# Patient Record
Sex: Female | Born: 1935 | Race: White | Hispanic: No | Smoking: Never smoker
Health system: Southern US, Community
[De-identification: ages and names within clinical notes are randomized; demographics above are authoritative.]

## PROBLEM LIST (undated history)

## (undated) DIAGNOSIS — R42 Dizziness and giddiness: Secondary | ICD-10-CM

## (undated) DIAGNOSIS — G20 Parkinson's disease: Secondary | ICD-10-CM

## (undated) DIAGNOSIS — E119 Type 2 diabetes mellitus without complications: Secondary | ICD-10-CM

## (undated) DIAGNOSIS — R413 Other amnesia: Secondary | ICD-10-CM

## (undated) DIAGNOSIS — Z9181 History of falling: Secondary | ICD-10-CM

## (undated) DIAGNOSIS — R269 Unspecified abnormalities of gait and mobility: Secondary | ICD-10-CM

## (undated) HISTORY — DX: Other amnesia: R41.3

## (undated) HISTORY — DX: History of falling: Z91.81

## (undated) HISTORY — DX: Unspecified abnormalities of gait and mobility: R26.9

## (undated) HISTORY — PX: VAGINAL HYSTERECTOMY: SUR661

## (undated) HISTORY — DX: Parkinson's disease: G20

## (undated) HISTORY — PX: OTHER SURGICAL HISTORY: SHX169

## (undated) HISTORY — DX: Dizziness and giddiness: R42

---

## 2008-09-14 ENCOUNTER — Encounter: Admission: RE | Admit: 2008-09-14 | Discharge: 2008-09-14 | Payer: Self-pay | Admitting: Emergency Medicine

## 2009-03-13 ENCOUNTER — Encounter: Admission: RE | Admit: 2009-03-13 | Discharge: 2009-03-13 | Payer: Self-pay | Admitting: Emergency Medicine

## 2010-03-08 ENCOUNTER — Inpatient Hospital Stay (HOSPITAL_COMMUNITY): Admission: EM | Admit: 2010-03-08 | Discharge: 2009-05-03 | Payer: Self-pay | Admitting: Emergency Medicine

## 2010-06-18 LAB — CBC
HCT: 39.9 % (ref 36.0–46.0)
MCHC: 33.2 g/dL (ref 30.0–36.0)
MCV: 94.7 fL (ref 78.0–100.0)
Platelets: 204 10*3/uL (ref 150–400)

## 2010-06-18 LAB — BASIC METABOLIC PANEL
BUN: 25 mg/dL — ABNORMAL HIGH (ref 6–23)
Chloride: 101 mEq/L (ref 96–112)
Potassium: 4.5 mEq/L (ref 3.5–5.1)

## 2010-06-18 LAB — DIFFERENTIAL
Basophils Relative: 1 % (ref 0–1)
Eosinophils Absolute: 0.2 10*3/uL (ref 0.0–0.7)
Neutrophils Relative %: 73 % (ref 43–77)

## 2010-06-18 LAB — POCT CARDIAC MARKERS: Troponin i, poc: <0.05 ng/mL (ref 0.00–0.09)

## 2010-06-21 LAB — DIFFERENTIAL
Eosinophils Relative: 2 % (ref 0–5)
Lymphocytes Relative: 24 % (ref 12–46)
Lymphs Abs: 1.8 10*3/uL (ref 0.7–4.0)
Neutrophils Relative %: 64 % (ref 43–77)

## 2010-06-21 LAB — COMPREHENSIVE METABOLIC PANEL
BUN: 14 mg/dL (ref 6–23)
BUN: 23 mg/dL (ref 6–23)
CO2: 25 mEq/L (ref 19–32)
CO2: 29 mEq/L (ref 19–32)
Calcium: 8.3 mg/dL — ABNORMAL LOW (ref 8.4–10.5)
Calcium: 8.8 mg/dL (ref 8.4–10.5)
Chloride: 102 mEq/L (ref 96–112)
Creatinine, Ser: 0.79 mg/dL (ref 0.4–1.2)
Creatinine, Ser: 0.95 mg/dL (ref 0.4–1.2)
GFR calc Af Amer: >60 mL/min (ref >60)
GFR calc non Af Amer: 57 mL/min — ABNORMAL LOW (ref >60)
GFR calc non Af Amer: >60 mL/min (ref >60)
Glucose, Bld: 119 mg/dL — ABNORMAL HIGH (ref 70–99)
Glucose, Bld: 121 mg/dL — ABNORMAL HIGH (ref 70–99)
Sodium: 137 mEq/L (ref 135–145)
Total Bilirubin: 0.8 mg/dL (ref 0.3–1.2)

## 2010-06-21 LAB — CBC
HCT: 36.1 % (ref 36.0–46.0)
Hemoglobin: 12.1 g/dL (ref 12.0–15.0)
MCHC: 33.7 g/dL (ref 30.0–36.0)
Platelets: 178 10*3/uL (ref 150–400)
Platelets: 181 10*3/uL (ref 150–400)
RDW: 14.9 % (ref 11.5–15.5)
WBC: 7.7 10*3/uL (ref 4.0–10.5)

## 2010-06-21 LAB — CARDIAC PANEL(CRET KIN+CKTOT+MB+TROPI)
CK, MB: 3.3 ng/mL (ref 0.3–4.0)
CK, MB: 3.3 ng/mL (ref 0.3–4.0)
Relative Index: INVALID (ref 0.0–2.5)
Relative Index: INVALID (ref 0.0–2.5)
Relative Index: INVALID (ref 0.0–2.5)
Total CK: 53 U/L (ref 7–177)
Troponin I: 0.01 ng/mL (ref 0.00–0.06)
Troponin I: 0.03 ng/mL (ref 0.00–0.06)

## 2010-06-21 LAB — TSH: TSH: 4.019 u[IU]/mL (ref 0.350–4.500)

## 2010-06-21 LAB — LIPID PANEL
Cholesterol: 165 mg/dL (ref 0–200)
HDL: 65 mg/dL (ref >39)
Total CHOL/HDL Ratio: 2.5 RATIO
Triglycerides: 42 mg/dL (ref <150)

## 2010-06-21 LAB — TROPONIN I: Troponin I: 0.01 ng/mL (ref 0.00–0.06)

## 2010-06-21 LAB — CK TOTAL AND CKMB (NOT AT ARMC): Relative Index: INVALID (ref 0.0–2.5)

## 2010-07-12 ENCOUNTER — Other Ambulatory Visit (HOSPITAL_COMMUNITY): Payer: Self-pay | Admitting: *Deleted

## 2010-08-01 ENCOUNTER — Ambulatory Visit (INDEPENDENT_AMBULATORY_CARE_PROVIDER_SITE_OTHER): Payer: Medicare Other | Admitting: Obstetrics & Gynecology

## 2010-08-01 DIAGNOSIS — N993 Prolapse of vaginal vault after hysterectomy: Secondary | ICD-10-CM

## 2010-08-02 NOTE — Assessment & Plan Note (Signed)
NAMECHELCIE, ESTORGA            ACCOUNT NO.:  [id number]  MEDICAL RECORD NO.:  [id number]          PATIENT TYPE:  LOCATION:  [facility] at [facility]           FACILITY:  PHYSICIAN:  Allie Bossier, MD        DATE OF BIRTH:  05-Mar-1928  DATE OF SERVICE:  08/01/2010                                 CLINIC NOTE  Ms. Kanno is an 76 year old widowed white, G2 P1 A1.  She comes in here as a referral from [facility] for genital prolapse. The patient herself denies any vaginal pressure or pain.  She does say that her biggest problem is that she has to get up and void approximately four times per night.  She says that if she does any Valsalva maneuvers, approximately 50% of the times she will have leaking of urine.  She denies urge incontinence, but she does wear Depends on a daily basis.  She is currently abstinent.  PAST MEDICAL HISTORY: 1. Parkinson's and its side effects. 2. Osteoporosis. 3. Hypertension. 4. Hypothyroidism. 5. Diabetes, type 2. 6. Hyperlipidemia.  PAST SURGICAL HISTORY:  She had a total abdominal hysterectomy in the past for benign indications.  On exam, she has complete prolapse of her vaginal cuff.  It is easily reducible.  The skin is relatively hypertrophic, but I have certainly seen worse.  There are no areas of excoriation.  After she had changed her clothes on her physical exam, I invited her son to come in for the discussion. I gave them printed literature on prolapse and explained at length her treatment options, specifically watchful waiting versus a pessary.  I would not consider her a surgical candidate as I have seen multiple failures of prolapse surgeries when the entire vaginal cuff has been prolapsed.  Because she is essentially asymptomatic, she chooses to delay or forego treatment entirely.  I have explained to her that should she decide to have a pessary she would need to use estrogen cream for a month prior to insertion of the  pessary and then use some vaginal estrogen while the pessary is in place.  All questions were answered. She will come back on a p.r.n. basis.  Thank you very much for this kind referral.     Allie Bossier, MD   MCD/MEDQ  D:  08/01/2010  T:  08/02/2010  Job:  [id number]  cc:   [facility] [address] [place]

## 2010-09-17 ENCOUNTER — Ambulatory Visit (HOSPITAL_COMMUNITY)
Admission: RE | Admit: 2010-09-17 | Discharge: 2010-09-17 | Disposition: A | Discharge status: Home / Self Care | Payer: Medicare Other | Source: Ambulatory Visit | Attending: Family Medicine | Admitting: Family Medicine

## 2010-09-17 DIAGNOSIS — Z13820 Encounter for screening for osteoporosis: Secondary | ICD-10-CM | POA: Insufficient documentation

## 2010-09-17 DIAGNOSIS — Z780 Asymptomatic menopausal state: Secondary | ICD-10-CM | POA: Insufficient documentation

## 2010-09-30 ENCOUNTER — Emergency Department (HOSPITAL_COMMUNITY): Payer: Medicare Other

## 2010-09-30 ENCOUNTER — Inpatient Hospital Stay (HOSPITAL_COMMUNITY)
Admission: EM | Admit: 2010-09-30 | Discharge: 2010-10-02 | DRG: 093 | Disposition: A | Discharge status: Home Health | Payer: Medicare Other | Attending: Family Medicine | Admitting: Family Medicine

## 2010-09-30 DIAGNOSIS — R269 Unspecified abnormalities of gait and mobility: Principal | ICD-10-CM | POA: Diagnosis present

## 2010-09-30 DIAGNOSIS — I10 Essential (primary) hypertension: Secondary | ICD-10-CM | POA: Diagnosis present

## 2010-09-30 DIAGNOSIS — E785 Hyperlipidemia, unspecified: Secondary | ICD-10-CM | POA: Diagnosis present

## 2010-09-30 DIAGNOSIS — G20A1 Parkinson's disease without dyskinesia, without mention of fluctuations: Secondary | ICD-10-CM | POA: Diagnosis present

## 2010-09-30 DIAGNOSIS — Z7982 Long term (current) use of aspirin: Secondary | ICD-10-CM

## 2010-09-30 DIAGNOSIS — Z951 Presence of aortocoronary bypass graft: Secondary | ICD-10-CM

## 2010-09-30 DIAGNOSIS — R32 Unspecified urinary incontinence: Secondary | ICD-10-CM | POA: Diagnosis present

## 2010-09-30 DIAGNOSIS — G20 Parkinson's disease: Secondary | ICD-10-CM | POA: Diagnosis present

## 2010-09-30 DIAGNOSIS — I2510 Atherosclerotic heart disease of native coronary artery without angina pectoris: Secondary | ICD-10-CM | POA: Diagnosis present

## 2010-09-30 LAB — CBC
Hemoglobin: 12.9 g/dL (ref 12.0–15.0)
MCH: 30.2 pg (ref 26.0–34.0)
MCV: 89.9 fL (ref 78.0–100.0)
RBC: 4.27 MIL/uL (ref 3.87–5.11)

## 2010-09-30 LAB — BASIC METABOLIC PANEL
BUN: 25 mg/dL — ABNORMAL HIGH (ref 6–23)
CO2: 25 mEq/L (ref 19–32)
Calcium: 8.9 mg/dL (ref 8.4–10.5)
Creatinine, Ser: 0.79 mg/dL (ref 0.50–1.10)

## 2010-09-30 LAB — URINALYSIS, ROUTINE W REFLEX MICROSCOPIC
Bilirubin Urine: NEGATIVE
Glucose, UA: NEGATIVE mg/dL
Hgb urine dipstick: NEGATIVE
Ketones, ur: NEGATIVE mg/dL
Protein, ur: NEGATIVE mg/dL

## 2010-09-30 LAB — CK TOTAL AND CKMB (NOT AT ARMC): Total CK: 38 U/L (ref 7–177)

## 2010-09-30 LAB — DIFFERENTIAL
Basophils Relative: 0 % (ref 0–1)
Lymphs Abs: 1.7 10*3/uL (ref 0.7–4.0)
Monocytes Relative: 8 % (ref 3–12)
Neutro Abs: 6.5 10*3/uL (ref 1.7–7.7)
Neutrophils Relative %: 71 % (ref 43–77)

## 2010-09-30 LAB — GLUCOSE, CAPILLARY: Glucose-Capillary: 105 mg/dL — ABNORMAL HIGH (ref 70–99)

## 2010-09-30 LAB — APTT: aPTT: 34 seconds (ref 24–37)

## 2010-09-30 LAB — TROPONIN I: Troponin I: <0.3 ng/mL (ref <0.30)

## 2010-10-01 DIAGNOSIS — G20 Parkinson's disease: Secondary | ICD-10-CM

## 2010-10-01 DIAGNOSIS — H8110 Benign paroxysmal vertigo, unspecified ear: Secondary | ICD-10-CM

## 2010-10-01 DIAGNOSIS — R269 Unspecified abnormalities of gait and mobility: Secondary | ICD-10-CM

## 2010-10-01 LAB — URINE CULTURE
Colony Count: 30000
Culture  Setup Time: 201207012053

## 2010-10-14 NOTE — H&P (Signed)
NAMEBRISTOL, OSENTOSKI            ACCOUNT NO.:  [id number]  MEDICAL RECORD NO.:  [id number]  LOCATION:  [facility]                         FACILITY:  [facility]  PHYSICIAN:  Eniya Cannady A. Sheffield Slider, M.D.    DATE OF BIRTH:  [birth year]  DATE OF ADMISSION:  09/30/2010 DATE OF DISCHARGE:                             HISTORY & PHYSICAL   PRIMARY CARE PHYSICIAN:  [facility], the patient also sees Dr. Terrace Arabia at [facility].  CHIEF COMPLAINT:  Unable to walk.  HISTORY OF PRESENT ILLNESS:  Ms. Monds is an 77 year old female presenting with a gait disturbance that began this morning.  The patient reports that yesterday morning, she tripped and fell.  She heard her left hand at that time, but had no other problems throughout the day. She and her family went on a trip to the mountains and she was acting her normal self.  The patient reports that she felt that she had to urinate for most of the night last night, and did have an episode of urinary incontinence last night, this is uncharacteristic for her.  She got up this morning and fell immediately after getting out of bed. Since that time, she has been falling to her right whenever she stands. Her son monitored her for several hours, and as symptoms did not improve, decided to bring her to the hospital.  Her son reports no confusion, changes in mental status, pain, or other concerning symptoms. The patient denies any symptoms of orthostasis.  The patient says that she has had some problems with the room spinning this morning, but not since then.  She reports that her legs feel very shaky.  She says that she could tell she was falling to her right, but could not do much about this.  The patient describes a vague "pressure" that was related to her falling, but she was unable to describe this sensation further.  REVIEW OF SYSTEMS:  Performed and was otherwise unremarkable except as noted above.  PAST MEDICAL HISTORY:  Notable for: 1.  Parkinson disease. 2. Hypertension. 3. Coronary artery disease status post CABG in 2003. 4. Hyperlipidemia. 5. Hysterectomy.  FAMILY HISTORY:  Notable for coronary artery disease and hypertension in first-degree relatives.  SOCIAL HISTORY:  Lives with son.  No alcohol, tobacco, or illicit drugs.  ALLERGIES:  No known drug allergies.  MEDICATIONS: 1. Lipitor 40 mg by mouth daily. 2. Propranolol 60 mg by mouth twice daily. 3. Carbidopa/levodopa 25/100 mg at 7 a.m., 10 a.m., 1 p.m., 4 p.m. 4. ReQuip XL 2 mg by mouth at 8 p.m. 5. Amlodipine 2.5 mg by mouth twice daily. 6. Aspirin 81 mg by mouth daily. 7. Levothyroxine 100 mcg by mouth daily. 8. Vitamin D3 one tablet by mouth daily. 9. Clonidine 0.1 mg by mouth at night if systolic blood pressure     greater than 150.  PHYSICAL EXAMINATION:  VITAL SIGNS:  Pulse 67, blood pressure 159/78, respirations 18, O2 sat is 97% on room air, current temperature is 98.2 degrees Fahrenheit. GENERAL:  No acute distress, cooperative with exam. HEENT:  Mucous membranes moist, extraocular movements intact, pupils equal, round, and reactive to light. CARDIOVASCULAR:  Regular rate and rhythm, 2/6 systolic murmur  best heard at the left upper sternal border. RESPIRATORY:  Clear to auscultation bilaterally. ABDOMEN:  Soft, nontender, nondistended. EXTREMITIES:  No edema, 2+ pulses. NEUROLOGIC:  Strength is 5+ in both upper and lower extremities, cogwheeling and parkinsonian tremor are present.  Tremor is more pronounced with movement.  The patient has a narrow-based stance, negative Romberg.  Dix-Hallpike maneuver is also negative.  Gait is very hesitant with a very slight rightward lean.  LABS AND IMAGING:  CBC notable for white count 9.1, hemoglobin 12.9, platelets 203.  Basic metabolic is unremarkable.  Troponins are negative x1.  INR is 1, and UA is remarkable only for trace leukocytes.  CT of the head shows no acute abnormalities, but chronic  small vessel white matter changes.  Chest x-ray shows cardiomegaly and hyperinflation.  MRI of the head shows atrophy and small vessel disease with no acute findings.  ASSESSMENT AND PLAN:  This is an 77 year old female with Parkinson, presenting with falls and weakness. 1. Gait disturbance:  It is unclear exactly what this patient's gait     disturbance is.  She has minimal objective findings on my exam, but     is very hesitant to walk and reports subjective right-sided     weakness when walking.  MRI is unremarkable.  I do not feel this     represents a cerebrovascular accident.  It could possibly represent     a transient ischemic attack, though I am not certain about this.     It does appear to be more of some type of vertigo that caused falls     and the patient now has a fear of falling.  Nonetheless, we will     bring her in for observation, consult Physical Therapy to evaluate the     patient for either home needs or placement, and will likely consult     neuro in the morning if the patient is still symptomatic. 2. Parkinson disease:  We will continue the patient on her home     medications. 3. Hypertension:  We will continue the patient on her home     medications. 4. Hyperlipidemia:  We will give the patient on her home medications. 5. Fluids, electrolytes, nutrition/gastrointestinal:  No evidence of     dysphasia or dysarthria on examination, so we     will give regular diet and Hep-Lock IV. 6. Prophylaxis:  Subcu heparin for venous thromboembolism. 7. Disposition:  Pending improvement or placement.    ______________________________ Majel Homer, MD   ______________________________ Arnette Norris. Sheffield Slider, M.D.    ER/MEDQ  D:  09/30/2010  T:  10/01/2010  Job:  [id number]  Electronically Signed by Manuela Neptune MD on 10/10/2010 12:52:21 PM Electronically Signed by Zachery Dauer M.D. on 10/14/2010 09:40:05 PM

## 2010-10-15 NOTE — Discharge Summary (Signed)
Erika Mcgrath, Erika Mcgrath            ACCOUNT NO.:  [id number]  MEDICAL RECORD NO.:  [id number]  LOCATION:  [facility]                         FACILITY:  [facility]  PHYSICIAN:  Paula Compton, MD        DATE OF BIRTH:  01/28/1928  DATE OF ADMISSION:  09/30/2010 DATE OF DISCHARGE:  10/02/2010                              DISCHARGE SUMMARY   DISCHARGE DIAGNOSES: 1. Gait disturbance. 2. Parkinson disease. 3. Hypertension. 4. Hyperlipidemia. 5. History of coronary artery disease, status post coronary artery     bypass graft in 2003.  DISCHARGE MEDICATIONS: 1. Propranolol 60 mg 1 tablet p.o. b.i.d. 2. Amlodipine 2.5 mg 1 tablet p.o. b.i.d. 3. Aspirin enteric coated 81 mg p.o. daily. 4. Clonidine 0.1 mg 1 tablet daily p.r.n., if blood pressure above 150     mmHg. 5. Carbidopa and levodopa 1 tablet p.o. q.i.d. at 7 a.m., 10 a.m., 1     p.m., and 4 p.m. 6. Ropinirole XL 2 mg 1 tablet p.o. daily at night. 7. Levothyroxine 100 mcg 1 tablet p.o. daily in the morning. 8. Lipitor 40 mg 1 tablet p.o. daily at night. 9. Vitamin D3 1000 units 1 capsule p.o. daily.  CONSULTS:  None.  PERTINENT STUDIES: 1. CT of the head revealing no acute abnormality, mild atrophy, and     mild chronic small vessel white matter ischemic changes     in both cerebral hemispheres. 2. Chest x-ray 2-view with cardiomegaly and hyperinflation with no     acute finding. 3.  MRI of the brain that showed atrophy of     small vessel disease. No acute intracranial findings.  No proximal     vessel thrombosis or cerebral abnormality.  No extra-axial     hematoma.  LABORATORY DATA:  Sodium 135, potassium 4.1, creatinine 0.79, BUN 25. Hemoglobin 12.9 and white blood count 9.1.  BRIEF HOSPITAL COURSE:  This is an 77 year old female with the history of Parkinson disease and hypertension, also hyperlipidemia that is admitted after episode of unstable gait and right-sided balance. 1. Gait disturbance.  She reported felt dizzy and  every time she tried     to stand up, she will tilt to the right.  During her     hospitalization, her neurologic physical exam was corresponding     with Parkinson disease, but there was no motor focalization     present.  CT of the head and MRI did not show any     acute event.  PT evaluated her and decided she would be a good     candidate for PT as an outpatient and further evaluation revealed     that she can go home with PT/OT and home health aide. 2. Hypertension.  The patient was found to be in the lower range of     blood pressure 98/71 and positive orthostatic that can be     contributed to her symptoms, so propranolol was decreased to half     dose for 1 day, but the blood pressure came back over 177 systolic,     so we decided to get back on her medication the way     she was taking it at  home of 50 mg tablet p.o. b.i.d. and follow with     her PCP. 3. Hyperlipidemia.  The patient with increased risk factors.  She is     on Lipitor 40 mg, we continued medication while hospitalized and follow as     outpatient. DISCHARGE INSTRUCTIONS Activity: Return to her daily activities slowly with home PT/OT and home health aide.  Diet: Low sodium, heart-healthy diet. Appointment with Dr. Terrace Arabia her neurologist and follow up within a week with her PCP at [facility].    ______________________________ Wayne Both, MD   ______________________________ Paula Compton, MD    DP/MEDQ  D:  10/02/2010  T:  10/03/2010  Job:  [id number]  Electronically Signed by Delorse Lek PAZ  on 10/07/2010 05:42:52 PM Electronically Signed by Paula Compton MD on 10/15/2010 08:52:56 AM

## 2012-08-14 ENCOUNTER — Encounter: Payer: Self-pay | Admitting: Neurology

## 2012-08-14 ENCOUNTER — Ambulatory Visit (INDEPENDENT_AMBULATORY_CARE_PROVIDER_SITE_OTHER): Payer: Medicare HMO | Admitting: Neurology

## 2012-08-14 VITALS — BP 130/71 | HR 65 | Ht 68.5 in | Wt 124.0 lb

## 2012-08-14 DIAGNOSIS — R413 Other amnesia: Secondary | ICD-10-CM

## 2012-08-14 DIAGNOSIS — R42 Dizziness and giddiness: Secondary | ICD-10-CM

## 2012-08-14 DIAGNOSIS — G20 Parkinson's disease: Secondary | ICD-10-CM

## 2012-08-14 DIAGNOSIS — G20A1 Parkinson's disease without dyskinesia, without mention of fluctuations: Secondary | ICD-10-CM | POA: Insufficient documentation

## 2012-08-14 DIAGNOSIS — Z9181 History of falling: Secondary | ICD-10-CM | POA: Insufficient documentation

## 2012-08-14 NOTE — Progress Notes (Signed)
HPI:  patient is a 79 year old left-handed Caucasian female,  accompanied by her daughter in law at today's clinical visit.   She has past medical history of hyperlipidemia, hypertension, dementia, coronary artery disease, presenting with for years history of Parkinson's disease.  The initial symptoms was resting tremor involving bilateral upper extremity, left worse than the right, she also lost her smell, gradually onset of gait difficulty, especially in past one year.  She was treated by  movement disorder specialist at [facility], Dr. Laurence Aly,  is currently taking Requip XL 2mg  II bid,  family switch her care to [facility] for [place], she is living with her son and daughter in law at [place] now for one year.  She has tried stalevo, could not tolerate the medication,  because of orthostatic dizziness, which is still bothersome for her, I am not sure the other medications she has tried.   She had fall in July 2012, MRI brain reviewed, moderate small vessel disease, no acute lesions, UA-, CMP showed bun/creat 25/0.9, suggestive of mild dehydration. she is receiving home PT, recovering, but has more confusion, unsteady gait, arm shaking than baseline.  She is  ambulating without much difficulty, but complains of excessive fatigue at evening time between 630 to 830pm after dinner. She does nodds off at noon, but not a formal nap. I have advise her to take a late afternoon short nap.   also has visual hallucination, almost daily basis, a small little girl, sometimes a grown up, even pointing gun at her.  but she self realize it is just the hallucination.  She is taking sinemet 25/100 4 times day, requip xr 2mg  qhs  UPDATE  May 15th 2014: She has lost her son. She continued to taking her Sinemet 4 times a day, at 7, 10,13 and 16 p.m. She is also taking Requip XR 2 mg every night, She still ambulate without assistance. She has more memory loss, lives with her daughter in law.  She had two transient  passing out when standing up, with documented orthostatic blood pressure changes, 90/50s.  She sleeps well,  She has good appetite.  She still has visual hallucination, there are people around, boys with axes ready to hit me.  She tried [employer] xr for 4 months, no significant improvement, she decided to taper it off.    Physical Exam  Neck: supple no carotid bruits Respiratory: clear to auscultation bilaterally Cardiovascular: regular rate rhythm  Neurologic Exam  Mental Status: pleasant, awake, alert, cooperative to history, talking, and casual conversation. oriented to time, year, kyphosis, moderate anterocollis, right tilt,  MMSE 16/30, she missed 2/3 recall, not oriented to date, day, clinic, she has difficulty spell WORLD backwards,   Cranial Nerves: CN II-XII pupils were equal round reactive to light.   Extraocular movements were full.  Visual fields were full on confrontational test.  Facial sensation and strength were normal.  Hearing was intact to finger rubbing bilaterally.  Uvula tongue were midline.  Head turning and shoulder shrugging were normal and symmetric.  Tongue protrusion into the cheeks strength were normal.  Motor: Left arm resting tremor,  mild bilateral arm, nuchal rigidity, no weakness, early fatiguability on rapid wrist opening and closure. she also mild bilateral shoulder and arm dyskinesia. Sensory: Normal to light touch, Coordination: There was no dysmetria noticed. Gait and Station: need to push up from seated position, veer to one side occasionally, moderate stride, stooped forward posture, small turning. Reflexes: Deep tendon reflexes:  hypoactive and symmetric  Assessment and Plan: 79 year old Caucasian female followup for idiopathic Parkinson's disease.  1. She is overall doing well with her current dose of Sinemet 25/100 four times a day, but she has peak dose dyskinesia, I have advised her to dcreased it to one tab tid, at 8, 12, 5pm, keep  requip xr 2mg  qhs  now. 2. Moderate exercise. 3. RTC in  6 months

## 2012-09-04 IMAGING — CR DG CHEST 2V
2 series · 2 of 2 positions shown · non-contrast
Comparison: 05/01/2009

CLINICAL DATA: Fall several times since yesterday.  Dizziness.
History of Parkinson's disease.

CHEST - 2 VIEW

[w chest lat]
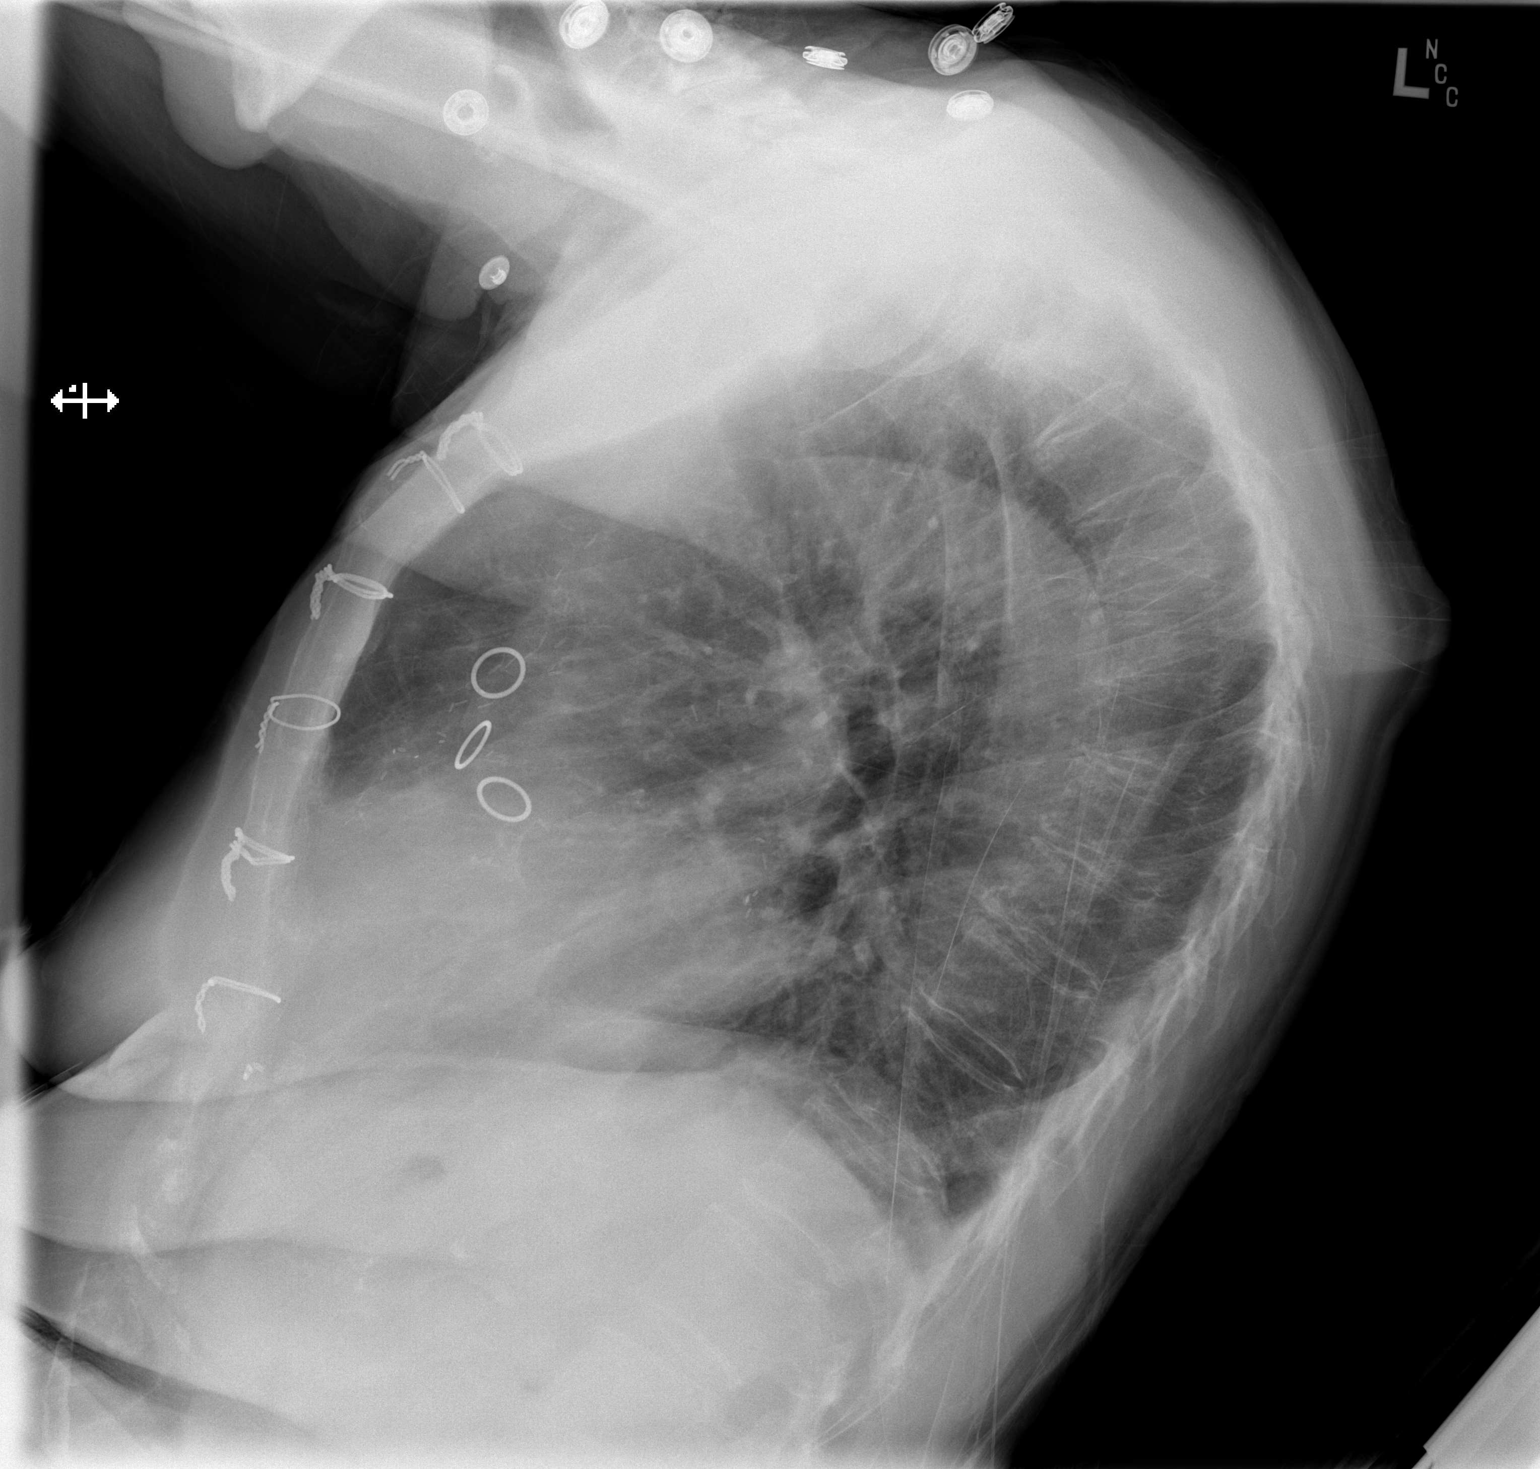

[x chest ap]
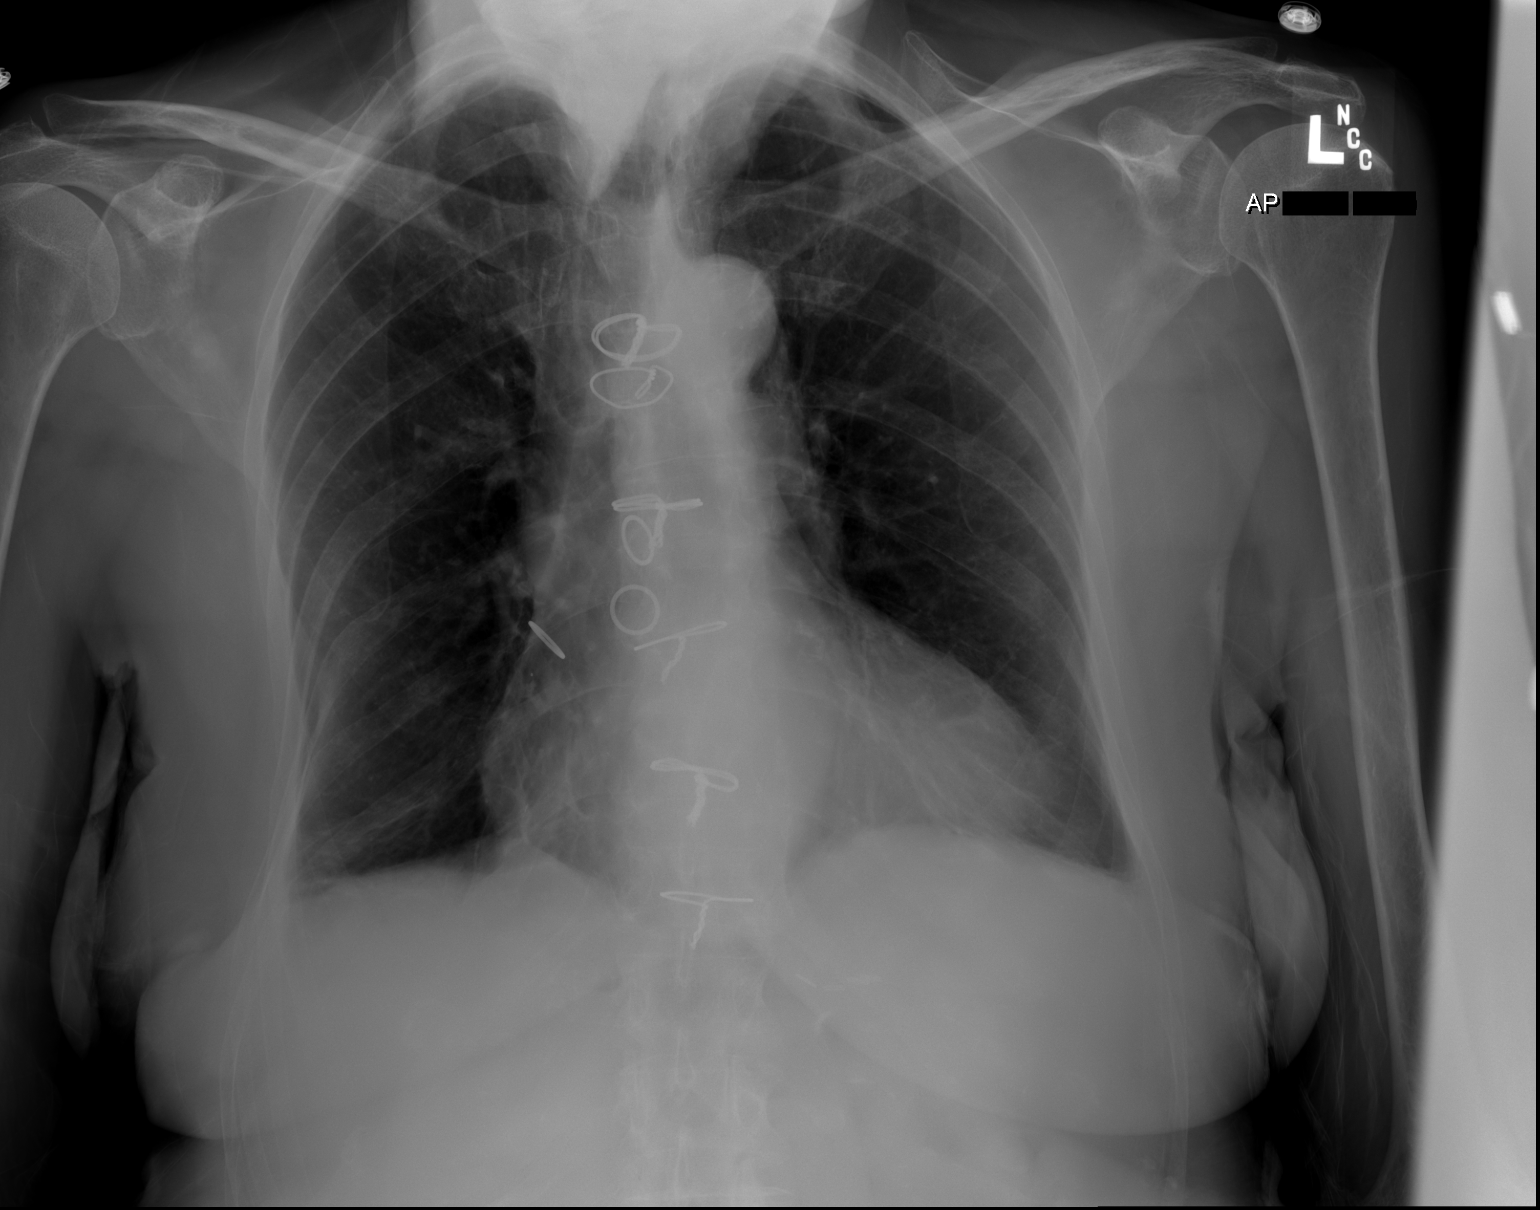

[2 of 2 positions shown; findings below may reference images not displayed]

FINDINGS: Mild hyperinflation.  Moderate osteopenia. Midline
trachea.  Moderate cardiomegaly.  Prior median sternotomy. Blunting
of the costophrenic angles on the frontal is due to pleural
thickening. No pneumothorax.  Mild scarring or volume loss at the
lung bases.
IMPRESSION: Cardiomegaly and hyperinflation. No acute findings.

## 2012-09-14 ENCOUNTER — Other Ambulatory Visit: Payer: Self-pay

## 2012-09-14 MED ORDER — CARBIDOPA-LEVODOPA 25-100 MG PO TABS: 1.0000 | ORAL_TABLET | Freq: Four times a day (QID) | ORAL | Status: DC

## 2012-11-02 ENCOUNTER — Encounter (HOSPITAL_COMMUNITY): Payer: Self-pay | Admitting: Emergency Medicine

## 2012-11-02 ENCOUNTER — Emergency Department (HOSPITAL_COMMUNITY)
Admission: EM | Admit: 2012-11-02 | Discharge: 2012-11-02 | Disposition: A | Discharge status: Home / Self Care | Payer: Medicare HMO | Attending: Emergency Medicine | Admitting: Emergency Medicine

## 2012-11-02 DIAGNOSIS — W06XXXA Fall from bed, initial encounter: Secondary | ICD-10-CM | POA: Insufficient documentation

## 2012-11-02 DIAGNOSIS — Z79899 Other long term (current) drug therapy: Secondary | ICD-10-CM | POA: Insufficient documentation

## 2012-11-02 DIAGNOSIS — Z7982 Long term (current) use of aspirin: Secondary | ICD-10-CM | POA: Insufficient documentation

## 2012-11-02 DIAGNOSIS — W010XXA Fall on same level from slipping, tripping and stumbling without subsequent striking against object, initial encounter: Secondary | ICD-10-CM

## 2012-11-02 DIAGNOSIS — Z8669 Personal history of other diseases of the nervous system and sense organs: Secondary | ICD-10-CM | POA: Insufficient documentation

## 2012-11-02 DIAGNOSIS — Z043 Encounter for examination and observation following other accident: Secondary | ICD-10-CM | POA: Insufficient documentation

## 2012-11-02 DIAGNOSIS — Y9389 Activity, other specified: Secondary | ICD-10-CM | POA: Insufficient documentation

## 2012-11-02 DIAGNOSIS — Y929 Unspecified place or not applicable: Secondary | ICD-10-CM | POA: Insufficient documentation

## 2012-11-02 DIAGNOSIS — Z9181 History of falling: Secondary | ICD-10-CM | POA: Insufficient documentation

## 2012-11-02 NOTE — ED Notes (Signed)
PER EMS- pt picked up from [facility] facility with c/o unwitnessed fall.  Reports getting out of bed and falling.  Denies head injury or LOC.  No deformities noted.  Alert and oriented x4.

## 2012-11-02 NOTE — ED Provider Notes (Signed)
CSN: [id number]     Arrival date & time 11/02/12  1234 History     First MD Initiated Contact with Patient 11/02/12 1242     Chief Complaint  Patient presents with  . Fall   (Consider location/radiation/quality/duration/timing/severity/associated sxs/prior Treatment) Patient is a 79 y.o. female presenting with fall. The history is provided by the patient.  Fall Pertinent negatives include no chest pain, no abdominal pain, no headaches and no shortness of breath.  [facility]/p [facility] at [facility].  Pt states was getting up from edge of bed to move to new location when slipped, fell. Denies injury.  Denies faintness or dizziness.  No loc. Denies hitting head. No headache. No neck or back pain.  Denies extremity pain or injury. No numbness or weakness. States recent health at baseline. States at baseline walks v slowly w cane.  No fever or chills. No anticoagulant use.     Past Medical History  Diagnosis Date  . Memory loss   . Personal history of fall   . Abnormality of gait   . Dizziness and giddiness   . Paralysis agitans    Past Surgical History  Procedure Laterality Date  . Vaginal hysterectomy    . Quad bypass     Family History  Problem Relation Age of Onset  . Leukemia    . Heart Problems    . Thyroid disease    . Depression    . High Cholesterol    . High blood pressure     History  Substance Use Topics  . Smoking status: Never Smoker   . Smokeless tobacco: Never Used  . Alcohol Use: No   OB History   Grav Para Term Preterm Abortions TAB SAB Ect Mult Living                 Review of Systems  Constitutional: Negative for fever.  HENT: Negative for neck pain.   Eyes: Negative for visual disturbance.  Respiratory: Negative for shortness of breath.   Cardiovascular: Negative for chest pain.  Gastrointestinal: Negative for abdominal pain.  Genitourinary: Negative for flank pain.  Musculoskeletal: Negative for back pain.  Skin: Negative for rash.  Neurological: Negative  for weakness, numbness and headaches.  Hematological: Does not bruise/bleed easily.  Psychiatric/Behavioral: Negative for confusion.    Allergies  Review of patient's allergies indicates no known allergies.  Home Medications   Current Outpatient Rx  Name  Route  Sig  Dispense  Refill  . amLODipine (NORVASC) 2.5 MG tablet      2.5 mg daily. 2 tablets daily         . aspirin 81 MG tablet   Oral   Take 81 mg by mouth daily.         Marland Kitchen atorvastatin (LIPITOR) 40 MG tablet      40 mg daily.         . carbidopa-levodopa (SINEMET IR) 25-100 MG per tablet   Oral   Take 1 tablet by mouth 4 (four) times daily. Four tablets daily   360 tablet   1   . Cholecalciferol (VITAMIN D-3) 1000 UNITS CAPS   Oral   Take by mouth daily.         . cloNIDine (CATAPRES) 0.1 MG tablet   Oral   Take 0.1 mg by mouth daily. As needed         . levothyroxine (SYNTHROID, LEVOTHROID) 100 MCG tablet      100 tablets daily. One tablet daily         .  Multiple Vitamins-Minerals (EYE VITAMINS) CAPS   Oral   Take by mouth daily.         . propranolol ER (INDERAL LA) 60 MG 24 hr capsule      60 mg daily.         Marland Kitchen rOPINIRole (REQUIP XL) 2 MG 24 hr tablet      2 mg daily.          BP 122/62  Pulse 65  Temp(Src) 98.2 F (36.8 C) (Oral)  Resp 16  SpO2 92% Physical Exam  Nursing note and vitals reviewed. Constitutional: She is oriented to person, place, and time. She appears well-developed and well-nourished. No distress.  HENT:  Head: Atraumatic.  Mouth/Throat: Oropharynx is clear and moist.  Eyes: Conjunctivae are normal. Pupils are equal, round, and reactive to light. No scleral icterus.  Neck: Neck supple. No tracheal deviation present.  Cardiovascular: Normal rate, regular rhythm, normal heart sounds and intact distal pulses.   Pulmonary/Chest: Effort normal and breath sounds normal. No respiratory distress. She exhibits no tenderness.  Abdominal: Soft. Normal  appearance. She exhibits no distension. There is no tenderness.  Musculoskeletal: She exhibits no edema and no tenderness.  CTLS spine, non tender, aligned, no step off. Good rom bil extremities without pain or focal bony tenderness.   Neurological: She is alert and oriented to person, place, and time.  Motor intact bil.   Skin: Skin is warm and dry. No rash noted.  Psychiatric: She has a normal mood and affect.    ED Course   Procedures (including critical care time)    MDM  Pt denies any complaint or pain. No loc. States feels at baseline and ready for d/c.  No focal tenderness on exam.  Pt appears stable for d/c.     Suzi Roots, MD 11/02/12 1309

## 2012-11-02 NOTE — Progress Notes (Signed)
CSW confirmed patient is resident at [facility] in [place] and plans to return when medically stable.   Catha Gosselin, LCSWA  [phone] .11/02/2012 120pm

## 2012-11-02 NOTE — ED Notes (Signed)
Pt was ambulatory with assistance with no issues.

## 2012-11-02 NOTE — ED Notes (Signed)
XBM:WU13<KG> Expected date:<BR> Expected time:<BR> Means of arrival:<BR> Comments:<BR> fall

## 2012-11-08 ENCOUNTER — Other Ambulatory Visit: Payer: Self-pay

## 2012-11-08 MED ORDER — ROPINIROLE HCL ER 2 MG PO TB24: 2.0000 mg | ORAL_TABLET | Freq: Every day | ORAL | Status: AC

## 2012-12-19 ENCOUNTER — Emergency Department (HOSPITAL_COMMUNITY)
Admission: EM | Admit: 2012-12-19 | Discharge: 2012-12-19 | Disposition: A | Discharge status: Skilled Nursing Facility | Payer: Medicare HMO | Attending: Emergency Medicine | Admitting: Emergency Medicine

## 2012-12-19 ENCOUNTER — Encounter (HOSPITAL_COMMUNITY): Payer: Self-pay | Admitting: Radiology

## 2012-12-19 ENCOUNTER — Emergency Department (HOSPITAL_COMMUNITY): Payer: Medicare HMO

## 2012-12-19 DIAGNOSIS — Y9210 Unspecified residential institution as the place of occurrence of the external cause: Secondary | ICD-10-CM | POA: Insufficient documentation

## 2012-12-19 DIAGNOSIS — Z7982 Long term (current) use of aspirin: Secondary | ICD-10-CM | POA: Insufficient documentation

## 2012-12-19 DIAGNOSIS — S0990XA Unspecified injury of head, initial encounter: Secondary | ICD-10-CM | POA: Insufficient documentation

## 2012-12-19 DIAGNOSIS — G20A1 Parkinson's disease without dyskinesia, without mention of fluctuations: Secondary | ICD-10-CM | POA: Insufficient documentation

## 2012-12-19 DIAGNOSIS — G20 Parkinson's disease: Secondary | ICD-10-CM | POA: Insufficient documentation

## 2012-12-19 DIAGNOSIS — Z79899 Other long term (current) drug therapy: Secondary | ICD-10-CM | POA: Insufficient documentation

## 2012-12-19 DIAGNOSIS — W19XXXA Unspecified fall, initial encounter: Secondary | ICD-10-CM

## 2012-12-19 DIAGNOSIS — Y939 Activity, unspecified: Secondary | ICD-10-CM | POA: Insufficient documentation

## 2012-12-19 DIAGNOSIS — Z8669 Personal history of other diseases of the nervous system and sense organs: Secondary | ICD-10-CM | POA: Insufficient documentation

## 2012-12-19 DIAGNOSIS — W07XXXA Fall from chair, initial encounter: Secondary | ICD-10-CM | POA: Insufficient documentation

## 2012-12-19 NOTE — ED Provider Notes (Signed)
Medical screening examination/treatment/procedure(s) were conducted as a shared visit with non-physician practitioner(s) and myself.  I personally evaluated the patient during the encounter and agree with physical exam and plan of care.    Patient is an 79 year old female with a history of cognitive impairment who presents emergency department from her assisted living facility after she fell out of her wheelchair and hit her head and right shoulder. Patient reports she was leaning over to take off her shoe when she fell. She states she did hit her head but did not lose consciousness. She is on anticoagulation. Patient is at her baseline mental status. She is neurologically intact. No sign of obvious trauma except abrasion to the posterior right shoulder. Will obtain CT imaging of head and neck. She is hemodynamically stable. Anticipate if imaging is negative, patient will be discharged back to her nursing facility.  Layla Maw Ward, DO 12/20/12 0022

## 2012-12-19 NOTE — ED Notes (Signed)
Per ems, pt from [facility], states pt fell out of a chair today, hit her head and right shoulder. Pt denies pain, not on anticoagulants. No LOC, no obvious deformities noted. Pt later told EMS that she has "head pain", states pain different from headache - unsure if pt has memory loss. bp 150/70, pulse 82, cbg 114, saO2 97% ra

## 2012-12-19 NOTE — ED Provider Notes (Signed)
CSN: [id number]     Arrival date & time 12/19/12  1728 History   First MD Initiated Contact with Patient 12/19/12 1739     Chief Complaint  Patient presents with  . Fall   (Consider location/radiation/quality/duration/timing/severity/associated sxs/prior Treatment) HPI Comments: Patient presents from [facility] due to a fall.  She reports that she bent over to take off her shoe while sitting in a chair and fell out of the chair.  She reports that she hit her head and her right shoulder on the floor.  Floor was a tiled floor.  She denies LOC.  Called staff at [facility].  Staff report that the patient was found on the ground conscious..  Staff reports that initially the patient was complaining of a headache, but she denies headache at this time.  She denies any pain.  No chest pain, SOB, vision changes, nausea, or vomiting.  Staff at assisted living facility report that the patient has some confusion at baseline.  Patient is currently not on any anticoagulants.    Patient is a 79 y.o. female presenting with fall. The history is provided by the patient (staff at assisted living facility.).  Fall    Past Medical History  Diagnosis Date  . Memory loss   . Personal history of fall   . Abnormality of gait   . Dizziness and giddiness   . Paralysis agitans    Past Surgical History  Procedure Laterality Date  . Vaginal hysterectomy    . Quad bypass     Family History  Problem Relation Age of Onset  . Leukemia    . Heart Problems    . Thyroid disease    . Depression    . High Cholesterol    . High blood pressure     History  Substance Use Topics  . Smoking status: Never Smoker   . Smokeless tobacco: Never Used  . Alcohol Use: No   OB History   Grav Para Term Preterm Abortions TAB SAB Ect Mult Living                 Review of Systems  All other systems reviewed and are negative.    Allergies  Review of patient's allergies indicates no known  allergies.  Home Medications   Current Outpatient Rx  Name  Route  Sig  Dispense  Refill  . aspirin EC 81 MG tablet   Oral   Take 81 mg by mouth daily.         Marland Kitchen atorvastatin (LIPITOR) 40 MG tablet   Oral   Take 40 mg by mouth every morning.          Marland Kitchen amLODipine (NORVASC) 2.5 MG tablet   Oral   Take 2.5 mg by mouth 2 (two) times daily.          . carbidopa-levodopa (SINEMET IR) 25-100 MG per tablet   Oral   Take 1 tablet by mouth 4 (four) times daily.         . Cholecalciferol (VITAMIN D-3) 1000 UNITS CAPS   Oral   Take 1,000 Units by mouth every morning.          . cloNIDine (CATAPRES) 0.1 MG tablet   Oral   Take 0.1 mg by mouth at bedtime as needed (For anxiety.).          Marland Kitchen propranolol (INDERAL) 60 MG tablet   Oral   Take 60 mg by mouth 2 (two) times  daily.         . rOPINIRole (REQUIP XL) 2 MG 24 hr tablet   Oral   Take 1 tablet (2 mg total) by mouth at bedtime.   30 tablet   6    BP 165/74  Pulse 69  Temp(Src) 98.4 F (36.9 C) (Oral)  Resp 20  SpO2 94% Physical Exam  Nursing note and vitals reviewed. Constitutional: She appears well-developed and well-nourished.  HENT:  Head: Normocephalic and atraumatic.  Mouth/Throat: Oropharynx is clear and moist.  Eyes: EOM are normal. Pupils are equal, round, and reactive to light.  Neck: Normal range of motion. Neck supple.  Cardiovascular: Normal rate, regular rhythm and normal heart sounds.   Pulmonary/Chest: Effort normal and breath sounds normal.  Abdominal: Soft. There is no tenderness.  Musculoskeletal: Normal range of motion.       Cervical back: She exhibits normal range of motion, no tenderness, no bony tenderness, no swelling, no edema and no deformity.       Thoracic back: She exhibits normal range of motion, no tenderness, no bony tenderness, no swelling, no edema and no deformity.       Lumbar back: She exhibits normal range of motion, no tenderness, no bony tenderness, no swelling,  no edema and no deformity.  Full ROM of upper and lower extremities bilaterally without pain  Neurological: She is alert. No cranial nerve deficit or sensory deficit.  Skin: Skin is warm and dry.     Psychiatric: She has a normal mood and affect.    ED Course  Procedures (including critical care time) Labs Review Labs Reviewed - No data to display Imaging Review Ct Head Wo Contrast  12/19/2012   *RADIOLOGY REPORT*  Clinical Data:  Fall with head trauma.  CT HEAD WITHOUT CONTRAST CT CERVICAL SPINE WITHOUT CONTRAST  Technique:  Multidetector CT imaging of the head and cervical spine was performed following the standard protocol without intravenous contrast.  Multiplanar CT image reconstructions of the cervical spine were also generated.  Comparison:  Head CT 01/07/2012  CT HEAD  Findings: Mild cerebral atrophy appears stable.  Negative for hemorrhage, hydrocephalus, mass effect, or evidence of acute infarction.  There are atherosclerotic valves of occasions in the in the visualized vertebral arteries.  There is a polyp or thickening in the right frontal sinus.  There are no air-fluid levels in the sinuses.  Mastoid air cells are clear.  The skull is intact.  The scalp and orbits are symmetric.  IMPRESSION: No acute intracranial abnormality.  CT CERVICAL SPINE  Findings: The patient is somewhat kyphotic position at the cervicothoracic junction, as seen on the scout view.  The cervical spine itself is extended in position. There is approximately 2 mm anterolisthesis of C4 on C5.  This is likely due to the patient's moderate facet joint degenerative changes spanning C2 through C5. The remainder the discs are normally aligned.  The disc spaces are preserved.  No acute fracture is identified.  Due to facet joint degenerative changes, there is mild bilateral neural foraminal narrowing at C4-5 and C5-6.  The prevertebral soft tissue contour is normal.  IMPRESSION: 1. No evidence of acute bony injury to the  cervical spine. 2.  Facet joint degenerative changes of the cervical spine. 3.  2 mm anterolisthesis of C4 on C5, likely due to facet joint degenerative change.   Original Report Authenticated By: Britta Mccreedy, M.D.   Ct Cervical Spine Wo Contrast  12/19/2012   *RADIOLOGY REPORT*  Clinical  Data:  Fall with head trauma.  CT HEAD WITHOUT CONTRAST CT CERVICAL SPINE WITHOUT CONTRAST  Technique:  Multidetector CT imaging of the head and cervical spine was performed following the standard protocol without intravenous contrast.  Multiplanar CT image reconstructions of the cervical spine were also generated.  Comparison:  Head CT 01/07/2012  CT HEAD  Findings: Mild cerebral atrophy appears stable.  Negative for hemorrhage, hydrocephalus, mass effect, or evidence of acute infarction.  There are atherosclerotic valves of occasions in the in the visualized vertebral arteries.  There is a polyp or thickening in the right frontal sinus.  There are no air-fluid levels in the sinuses.  Mastoid air cells are clear.  The skull is intact.  The scalp and orbits are symmetric.  IMPRESSION: No acute intracranial abnormality.  CT CERVICAL SPINE  Findings: The patient is somewhat kyphotic position at the cervicothoracic junction, as seen on the scout view.  The cervical spine itself is extended in position. There is approximately 2 mm anterolisthesis of C4 on C5.  This is likely due to the patient's moderate facet joint degenerative changes spanning C2 through C5. The remainder the discs are normally aligned.  The disc spaces are preserved.  No acute fracture is identified.  Due to facet joint degenerative changes, there is mild bilateral neural foraminal narrowing at C4-5 and C5-6.  The prevertebral soft tissue contour is normal.  IMPRESSION: 1. No evidence of acute bony injury to the cervical spine. 2.  Facet joint degenerative changes of the cervical spine. 3.  2 mm anterolisthesis of C4 on C5, likely due to facet joint degenerative  change.   Original Report Authenticated By: Britta Mccreedy, M.D.    6:03 PM Discussed patient with staff at [facility].  They report that the fall was unwitnessed.  Patient was found conscious  MDM  No diagnosis found. Patient presenting today after a fall.  She did hit her head when she fell.  CT head and CT cervical spine negative for acute findings.  Patient with full ROM of upper and lower extremities bilaterally without pain.  Feel that patient is stable for discharge back to assisted living facility.      Pascal Lux [place], PA-C 12/21/12 1143

## 2013-02-15 ENCOUNTER — Ambulatory Visit (INDEPENDENT_AMBULATORY_CARE_PROVIDER_SITE_OTHER): Payer: Medicare HMO | Admitting: Neurology

## 2013-02-15 ENCOUNTER — Encounter (INDEPENDENT_AMBULATORY_CARE_PROVIDER_SITE_OTHER): Payer: Self-pay

## 2013-02-15 ENCOUNTER — Encounter: Payer: Self-pay | Admitting: Neurology

## 2013-02-15 VITALS — BP 125/72 | HR 72 | Wt 128.0 lb

## 2013-02-15 DIAGNOSIS — Z9181 History of falling: Secondary | ICD-10-CM

## 2013-02-15 DIAGNOSIS — G20A1 Parkinson's disease without dyskinesia, without mention of fluctuations: Secondary | ICD-10-CM

## 2013-02-15 DIAGNOSIS — G20 Parkinson's disease: Secondary | ICD-10-CM

## 2013-02-15 DIAGNOSIS — R413 Other amnesia: Secondary | ICD-10-CM

## 2013-02-15 DIAGNOSIS — R42 Dizziness and giddiness: Secondary | ICD-10-CM

## 2013-02-15 MED ORDER — DONEPEZIL HCL 5 MG PO TABS: 5.0000 mg | ORAL_TABLET | Freq: Every day | ORAL | Status: AC

## 2013-02-15 NOTE — Progress Notes (Signed)
HPI:  patient is a 79 year old left-handed Caucasian female,  accompanied by her daughter in law at today's clinical visit.   She has past medical history of hyperlipidemia, hypertension, dementia, coronary artery disease, presenting with for years history of Parkinson's disease.  The initial symptoms was resting tremor involving bilateral upper extremity, left worse than the right, she also lost her smell, gradually onset of gait difficulty, especially in past one year.  She was treated by  movement disorder specialist at [facility], Dr. Laurence Aly,  is currently taking Requip XL 2mg  II bid,  family switch her care to [facility] for convience, she is living with her son and daughter in law at [place] now for one year.  She has tried stalevo, could not tolerate the medication,  because of orthostatic dizziness, which is still bothersome for her, I am not sure the other medications she has tried.   She had fall in July 2012, MRI brain reviewed, moderate small vessel disease, no acute lesions, UA-, CMP showed bun/creat 25/0.9, suggestive of mild dehydration. she is receiving home PT, recovering, but has more confusion, unsteady gait, arm shaking than baseline.  She is  ambulating without much difficulty, but complains of excessive fatigue at evening time between 630 to 830pm after dinner. She does nodds off at noon, but not a formal nap. I have advise her to take a late afternoon short nap.   also has visual hallucination, almost daily basis, a small little girl, sometimes a grown up, even pointing gun at her.  but she self realize it is just the hallucination.  She is taking sinemet 25/100 4 times day, requip xr 2mg  qhs  She has lost her son. She continued to taking her Sinemet 4 times a day, at 7, 10,13 and 16 p.m. She is also taking Requip XR 2 mg every night, She still ambulate without assistance. She has more memory loss, lives with her daughter in law.  She had two transient passing out when  standing up, with documented orthostatic blood pressure changes, 90/50s.  She sleeps well,  She has good appetite.  She still has visual hallucination, there are people around, boys with axes ready to hit me.  She tried [employer] xr for 4 months, no significant improvement, she decided to taper it off.   UPDATE 02/15/2013: She is at assistant living since August 2014, she has fallen 3 times, had CT head was normal in Sept 2014,  CT cervical showed degenerative disc disease.  She uses walker now.  She has progressive worsening memory loss.   She has words finding difficulty. Previously, she tried [employer], she thought that she was getting worse.  She still has visual hallucinations.    Physical Exam  Neck: supple no carotid bruits Respiratory: clear to auscultation bilaterally Cardiovascular: regular rate rhythm  Neurologic Exam  Mental Status: pleasant, awake, alert, cooperative to history, talking, and casual conversation. oriented to time, year, kyphosis, moderately severe anterocollis, right tilt,  MMSE 15/30, she missed 2/3 recall, not oriented to date, day, clinic, she has difficulty spell WORLD backwards,   Cranial Nerves: CN II-XII pupils were equal round reactive to light.   Extraocular movements were full.  Visual fields were full on confrontational test.  Facial sensation and strength were normal.  Hearing was intact to finger rubbing bilaterally.  Uvula tongue were midline.  Head turning and shoulder shrugging were normal and symmetric.  Tongue protrusion into the cheeks strength were normal.  Motor: Left arm resting tremor,  mild bilateral arm, nuchal rigidity, no weakness, early fatiguability on rapid wrist opening and closure. she also mild bilateral shoulder and arm dyskinesia. Sensory: Normal to light touch, Coordination: There was no dysmetria noticed. Gait and Station: need to push up from seated position, veer to one side occasionally, moderate stride, stooped forward posture,  small turning. Reflexes: Deep tendon reflexes:  hypoactive and symmetric  Assessment and Plan: 79 year old Caucasian female followup for idiopathic Parkinson's disease now with memory loss.  1. She is overall doing well with her current dose of Sinemet 25/100 three times a day, requip xr 2mg  qhs now. 2. Moderate exercise. Physical therapy 3. She has significant memory loss, will add on Aricept 5mg  qday. 4. RTC in 6 month with Eber Jones

## 2013-03-26 ENCOUNTER — Encounter (HOSPITAL_COMMUNITY): Payer: Self-pay | Admitting: Emergency Medicine

## 2013-03-26 ENCOUNTER — Emergency Department (HOSPITAL_COMMUNITY): Payer: Medicare HMO

## 2013-03-26 ENCOUNTER — Other Ambulatory Visit: Payer: Self-pay

## 2013-03-26 ENCOUNTER — Emergency Department (HOSPITAL_COMMUNITY)
Admission: EM | Admit: 2013-03-26 | Discharge: 2013-03-26 | Disposition: A | Discharge status: Home / Self Care | Payer: Medicare HMO | Attending: Emergency Medicine | Admitting: Emergency Medicine

## 2013-03-26 DIAGNOSIS — E119 Type 2 diabetes mellitus without complications: Secondary | ICD-10-CM | POA: Insufficient documentation

## 2013-03-26 DIAGNOSIS — G20A1 Parkinson's disease without dyskinesia, without mention of fluctuations: Secondary | ICD-10-CM | POA: Insufficient documentation

## 2013-03-26 DIAGNOSIS — S0990XA Unspecified injury of head, initial encounter: Secondary | ICD-10-CM | POA: Insufficient documentation

## 2013-03-26 DIAGNOSIS — I951 Orthostatic hypotension: Secondary | ICD-10-CM | POA: Insufficient documentation

## 2013-03-26 DIAGNOSIS — Y9210 Unspecified residential institution as the place of occurrence of the external cause: Secondary | ICD-10-CM | POA: Insufficient documentation

## 2013-03-26 DIAGNOSIS — R55 Syncope and collapse: Secondary | ICD-10-CM | POA: Insufficient documentation

## 2013-03-26 DIAGNOSIS — Y9389 Activity, other specified: Secondary | ICD-10-CM | POA: Insufficient documentation

## 2013-03-26 DIAGNOSIS — Z79899 Other long term (current) drug therapy: Secondary | ICD-10-CM | POA: Insufficient documentation

## 2013-03-26 DIAGNOSIS — G20 Parkinson's disease: Secondary | ICD-10-CM | POA: Insufficient documentation

## 2013-03-26 DIAGNOSIS — R296 Repeated falls: Secondary | ICD-10-CM | POA: Insufficient documentation

## 2013-03-26 DIAGNOSIS — Z7982 Long term (current) use of aspirin: Secondary | ICD-10-CM | POA: Insufficient documentation

## 2013-03-26 DIAGNOSIS — Z951 Presence of aortocoronary bypass graft: Secondary | ICD-10-CM | POA: Insufficient documentation

## 2013-03-26 DIAGNOSIS — Z9181 History of falling: Secondary | ICD-10-CM | POA: Insufficient documentation

## 2013-03-26 DIAGNOSIS — R011 Cardiac murmur, unspecified: Secondary | ICD-10-CM | POA: Insufficient documentation

## 2013-03-26 HISTORY — DX: Type 2 diabetes mellitus without complications: E11.9

## 2013-03-26 NOTE — ED Provider Notes (Signed)
CSN: [id number]     Arrival date & time 03/26/13  1152 History   First MD Initiated Contact with Patient 03/26/13 1205     Chief Complaint  Patient presents with  . Fall   (Consider location/radiation/quality/duration/timing/severity/associated sxs/prior Treatment) HPI Comments: 79 yo female presenting after an unwitnessed fall at her ALF.  She reports standing up from the bathroom and feeling lightheadedness, causing her fall.  She does not think she passed out, but is not completely sure.  She complains only of posterior right scalp pain.    Patient is a 79 y.o. female presenting with fall.  Fall This is a recurrent problem. Episode onset: a few hours ago. Episode frequency: once. The problem has been resolved. Associated symptoms include headaches. Pertinent negatives include no chest pain, no abdominal pain and no shortness of breath. Associated symptoms comments: Lightheadedness. Nothing aggravates the symptoms. Nothing relieves the symptoms.    Past Medical History  Diagnosis Date  . Memory loss   . Personal history of fall   . Abnormality of gait   . Dizziness and giddiness   . Paralysis agitans   . Diabetes mellitus without complication    Past Surgical History  Procedure Laterality Date  . Vaginal hysterectomy    . Quad bypass     Family History  Problem Relation Age of Onset  . Leukemia    . Heart Problems    . Thyroid disease    . Depression    . High Cholesterol    . High blood pressure     History  Substance Use Topics  . Smoking status: Never Smoker   . Smokeless tobacco: Never Used  . Alcohol Use: No   OB History   Grav Para Term Preterm Abortions TAB SAB Ect Mult Living                 Review of Systems  Constitutional: Negative for fever.  HENT: Positive for congestion.   Respiratory: Negative for cough and shortness of breath.   Cardiovascular: Negative for chest pain.  Gastrointestinal: Negative for nausea, vomiting, abdominal pain and  diarrhea.  Neurological: Positive for headaches.  All other systems reviewed and are negative.    Allergies  Review of patient's allergies indicates no known allergies.  Home Medications   Current Outpatient Rx  Name  Route  Sig  Dispense  Refill  . acetaminophen (TYLENOL) 650 MG CR tablet   Oral   Take 650 mg by mouth every 8 (eight) hours as needed for pain.         Marland Kitchen amLODipine (NORVASC) 2.5 MG tablet   Oral   Take 2.5 mg by mouth 2 (two) times daily.          Marland Kitchen aspirin EC 81 MG tablet   Oral   Take 81 mg by mouth daily.         Marland Kitchen atorvastatin (LIPITOR) 40 MG tablet   Oral   Take 40 mg by mouth every morning.          . carbidopa-levodopa (SINEMET IR) 25-100 MG per tablet   Oral   Take 1 tablet by mouth 3 (three) times daily.          . Cholecalciferol (VITAMIN D-3) 1000 UNITS CAPS   Oral   Take 1,000 Units by mouth every morning.          . cloNIDine (CATAPRES) 0.1 MG tablet   Oral   Take 0.1 mg by mouth at bedtime  as needed (For anxiety.).          Marland Kitchen donepezil (ARICEPT) 5 MG tablet   Oral   Take 1 tablet (5 mg total) by mouth at bedtime.   30 tablet   12   . levothyroxine (SYNTHROID, LEVOTHROID) 100 MCG tablet      100 mcg daily.         . propranolol (INDERAL) 60 MG tablet   Oral   Take 60 mg by mouth 2 (two) times daily.         Marland Kitchen rOPINIRole (REQUIP XL) 2 MG 24 hr tablet   Oral   Take 1 tablet (2 mg total) by mouth at bedtime.   30 tablet   6    BP 186/70  Pulse 57  Temp(Src) 97.5 F (36.4 C) (Oral)  Resp 16  SpO2 97% Physical Exam  Nursing note and vitals reviewed. Constitutional: She is oriented to person, place, and time. She appears well-developed and well-nourished. No distress.  HENT:  Head: Normocephalic.  Mouth/Throat: Oropharynx is clear and moist.  Mild tenderness to posterior right scalp  Eyes: Conjunctivae are normal. Pupils are equal, round, and reactive to light. No scleral icterus.  Neck: Normal range  of motion. Neck supple.  Cardiovascular: Normal rate, regular rhythm and intact distal pulses.   Murmur heard.  Systolic murmur is present with a grade of 1/6  Pulmonary/Chest: Effort normal and breath sounds normal. No stridor. No respiratory distress. She has no rales.  Abdominal: Soft. Bowel sounds are normal. She exhibits no distension. There is no tenderness.  Musculoskeletal: Normal range of motion.       Right shoulder: She exhibits normal range of motion and no tenderness.       Left shoulder: She exhibits normal range of motion and no tenderness.       Right hip: She exhibits no tenderness.       Left hip: She exhibits no tenderness.       Cervical back: She exhibits no tenderness.       Thoracic back: She exhibits no tenderness.       Lumbar back: She exhibits no tenderness.  Neurological: She is alert and oriented to person, place, and time.  Skin: Skin is warm and dry. No rash noted.  Psychiatric: She has a normal mood and affect. Her behavior is normal.    ED Course  Procedures (including critical care time) Labs Review Labs Reviewed - No data to display Imaging Review Ct Head Wo Contrast  03/26/2013   CLINICAL DATA:  Fall.  EXAM: CT HEAD WITHOUT CONTRAST  TECHNIQUE: Contiguous axial images were obtained from the base of the skull through the vertex without intravenous contrast.  COMPARISON:  CT 12/19/2012.  FINDINGS: Prominent artifact noted about this exam due to inability to position patient appropriately. No mass or hydrocephalus. Diffuse atrophy. No definite hemorrhage. No acute bony abnormality. Frontal, bilateral ethmoidal and maxillary sinus mucosal thickening. Findings are consistent with sinusitis.  IMPRESSION: Findings consistent with sinusitis. Otherwise no acute intracranial abnormality identified. Exam is limited due to patient positioning.   Electronically Signed   By: Maisie Fus  Register   On: 03/26/2013 13:33   Ct Cervical Spine Wo Contrast  03/26/2013    CLINICAL DATA:  Fall.  EXAM: CT CERVICAL SPINE WITHOUT CONTRAST  TECHNIQUE: Multidetector CT imaging of the cervical spine was performed without intravenous contrast. Multiplanar CT image reconstructions were also generated.  COMPARISON:  CT 12/19/2012 .  FINDINGS: Diffuse degenerative changes of  the cervical spine noted with mild subluxation C4 on C5. No evidence of fracture or dislocation, limited exam due to difficulty in positioning patient.  IMPRESSION: Severe degenerative changes cervical spine with stable mild degenerative subluxation C4 on C5. No acute abnormality. Limited exam due to patient position.   Electronically Signed   By: Maisie Fus  Register   On: 03/26/2013 13:39  All radiology studies independently viewed by me.     EKG Interpretation   None     EKG - Sinus bradycardia, rate 56, normal axis, normal intervals, nonspecific t wave abnormality, similar to prior.   MDM   1. Near syncope   2. Orthostasis   3. Closed head injury, initial encounter    79 yo female with fall secondary to dizziness.  Likely orthostatic given recent standing up from bathroom.  Her daughter-in-law reports that she has had similar near syncopal and syncopal episodes from standing up in the past.  Physical exam relatively benign. She does have a minimal systolic murmur, which her daughter-in-law thinks she has had a past which, on review of her chart, is consistent with prior notes.  Her workup was notable for orthostasis. This is likely secondary to medication effect. Her daughter-in-law states that her doctors have had difficulty managing her labile blood pressure, which has resulted in orthostasis in the past. I had a long discussion with her outlining the pros and cons of admission for further evaluation and monitoring. She preferred to avoid admission, blood work, IV placement. She seemed reasonably capable of making such a decision, and her decision was backed up by her family members who were present. She  will followup closely with her primary doctor.  Candyce Churn, MD 03/26/13 [phone]

## 2013-03-26 NOTE — ED Notes (Signed)
I went into patient's room to draw I Stat and MD Wofford said he was going to talk to her about blood work. Will reassess when MD Wofford is out of room.

## 2013-03-26 NOTE — ED Notes (Signed)
Pt from [facility], had fall this am. Hit head, denies LOC, not on blood thinners.

## 2013-03-26 NOTE — ED Notes (Signed)
Bed: WA09 Expected date:  Expected time:  Means of arrival:  Comments: EMS Fall 

## 2013-03-26 NOTE — ED Notes (Signed)
Pt ambulated with daughter to restroom

## 2013-03-27 ENCOUNTER — Encounter (HOSPITAL_COMMUNITY): Payer: Self-pay | Admitting: Emergency Medicine

## 2013-03-27 ENCOUNTER — Emergency Department (HOSPITAL_COMMUNITY)
Admission: EM | Admit: 2013-03-27 | Discharge: 2013-03-27 | Disposition: A | Discharge status: Home / Self Care | Payer: Medicare HMO | Attending: Emergency Medicine | Admitting: Emergency Medicine

## 2013-03-27 ENCOUNTER — Emergency Department (HOSPITAL_COMMUNITY): Payer: Medicare HMO

## 2013-03-27 DIAGNOSIS — E119 Type 2 diabetes mellitus without complications: Secondary | ICD-10-CM | POA: Insufficient documentation

## 2013-03-27 DIAGNOSIS — Y9210 Unspecified residential institution as the place of occurrence of the external cause: Secondary | ICD-10-CM | POA: Insufficient documentation

## 2013-03-27 DIAGNOSIS — Z79899 Other long term (current) drug therapy: Secondary | ICD-10-CM | POA: Insufficient documentation

## 2013-03-27 DIAGNOSIS — Y9301 Activity, walking, marching and hiking: Secondary | ICD-10-CM | POA: Insufficient documentation

## 2013-03-27 DIAGNOSIS — E876 Hypokalemia: Secondary | ICD-10-CM

## 2013-03-27 DIAGNOSIS — M25551 Pain in right hip: Secondary | ICD-10-CM

## 2013-03-27 DIAGNOSIS — N390 Urinary tract infection, site not specified: Secondary | ICD-10-CM | POA: Insufficient documentation

## 2013-03-27 DIAGNOSIS — W1809XA Striking against other object with subsequent fall, initial encounter: Secondary | ICD-10-CM | POA: Insufficient documentation

## 2013-03-27 DIAGNOSIS — S0003XA Contusion of scalp, initial encounter: Secondary | ICD-10-CM | POA: Insufficient documentation

## 2013-03-27 DIAGNOSIS — G20A1 Parkinson's disease without dyskinesia, without mention of fluctuations: Secondary | ICD-10-CM | POA: Insufficient documentation

## 2013-03-27 DIAGNOSIS — Z7982 Long term (current) use of aspirin: Secondary | ICD-10-CM | POA: Insufficient documentation

## 2013-03-27 DIAGNOSIS — G20 Parkinson's disease: Secondary | ICD-10-CM | POA: Insufficient documentation

## 2013-03-27 DIAGNOSIS — S79919A Unspecified injury of unspecified hip, initial encounter: Secondary | ICD-10-CM | POA: Insufficient documentation

## 2013-03-27 HISTORY — DX: Parkinson's disease: G20

## 2013-03-27 LAB — URINALYSIS, ROUTINE W REFLEX MICROSCOPIC
Bilirubin Urine: NEGATIVE
Glucose, UA: NEGATIVE mg/dL
Nitrite: POSITIVE — AB
Protein, ur: NEGATIVE mg/dL
pH: 6.5 (ref 5.0–8.0)

## 2013-03-27 LAB — POCT I-STAT, CHEM 8
BUN: 14 mg/dL (ref 6–23)
Chloride: 95 mEq/L — ABNORMAL LOW (ref 96–112)
Glucose, Bld: 167 mg/dL — ABNORMAL HIGH (ref 70–99)
HCT: 39 % (ref 36.0–46.0)
Hemoglobin: 13.3 g/dL (ref 12.0–15.0)
Sodium: 138 mEq/L (ref 135–145)
TCO2: 28 mmol/L (ref 0–100)

## 2013-03-27 LAB — URINE MICROSCOPIC-ADD ON

## 2013-03-27 MED ORDER — CEPHALEXIN 500 MG PO CAPS: 500.0000 mg | ORAL_CAPSULE | Freq: Four times a day (QID) | ORAL | Status: AC

## 2013-03-27 MED ORDER — CEPHALEXIN 500 MG PO CAPS
500.0000 mg | ORAL_CAPSULE | Freq: Once | ORAL | Status: AC
  Administered 2013-03-27: 500 mg via ORAL
  Filled 2013-03-27: qty 1

## 2013-03-27 MED ORDER — POTASSIUM CHLORIDE ER 10 MEQ PO TBCR: 10.0000 meq | EXTENDED_RELEASE_TABLET | Freq: Every day | ORAL | Status: AC

## 2013-03-27 MED ORDER — POTASSIUM CHLORIDE CRYS ER 20 MEQ PO TBCR
40.0000 meq | EXTENDED_RELEASE_TABLET | Freq: Once | ORAL | Status: AC
  Administered 2013-03-27: 40 meq via ORAL
  Filled 2013-03-27: qty 2

## 2013-03-27 NOTE — ED Provider Notes (Signed)
Accepted care from Dr. Patria Mane. 20F here w/ fall. Awaiting UA. Workup otherwise non-contrib.   Found to have UTI, will tx w/ keflex.   Clinical Impression 1. Scalp hematoma, initial encounter   2. Hypokalemia   3. Right hip pain   4. UTI (lower urinary tract infection)      Junius Argyle, MD 03/28/13 1202

## 2013-03-27 NOTE — ED Notes (Signed)
Per EMS pt states leaned over and lost her balance resulting hematoma above right eye. Pt denies pain and LOC.

## 2013-03-27 NOTE — ED Provider Notes (Signed)
CSN: [id number]     Arrival date & time 03/27/13  1420 History   First MD Initiated Contact with Patient 03/27/13 1448     Chief Complaint  Patient presents with  . Fall   HPI Patient reports she was walking today at the assisted living facility when she lost her balance and fell striking her right hand.  She does not know when she struck this on.  She presents with a hematoma of her right forehead as well as complaints of mild right hip pain.  She denies pain with range of motion of her right hip.  She denies neck pain.  No weakness of her upper lower extremities.  Family reports no altered mental status.  No nausea or vomiting.  She denies urinary complaints.  Her recent fever or chills.  No recent illness.  She denies nausea vomiting or diarrhea.  She did have a fall yesterday as well which resulted in an evaluation in the emergency room.    Past Medical History  Diagnosis Date  . Memory loss   . Personal history of fall   . Abnormality of gait   . Dizziness and giddiness   . Paralysis agitans   . Diabetes mellitus without complication   . Parkinson disease    Past Surgical History  Procedure Laterality Date  . Vaginal hysterectomy    . Quad bypass     Family History  Problem Relation Age of Onset  . Leukemia    . Heart Problems    . Thyroid disease    . Depression    . High Cholesterol    . High blood pressure     History  Substance Use Topics  . Smoking status: Never Smoker   . Smokeless tobacco: Never Used  . Alcohol Use: No   OB History   Grav Para Term Preterm Abortions TAB SAB Ect Mult Living                 Review of Systems  All other systems reviewed and are negative.    Allergies  Review of patient's allergies indicates no known allergies.  Home Medications   Current Outpatient Rx  Name  Route  Sig  Dispense  Refill  . amLODipine (NORVASC) 2.5 MG tablet   Oral   Take 2.5 mg by mouth 2 (two) times daily.          Marland Kitchen aspirin EC 81 MG tablet    Oral   Take 81 mg by mouth daily.         Marland Kitchen atorvastatin (LIPITOR) 40 MG tablet   Oral   Take 40 mg by mouth every morning.          . carbidopa-levodopa (SINEMET IR) 25-100 MG per tablet   Oral   Take 1 tablet by mouth 3 (three) times daily.          . Cholecalciferol (VITAMIN D-3) 1000 UNITS CAPS   Oral   Take 1,000 Units by mouth every morning.          . donepezil (ARICEPT) 5 MG tablet   Oral   Take 1 tablet (5 mg total) by mouth at bedtime.   30 tablet   12   . levothyroxine (SYNTHROID, LEVOTHROID) 100 MCG tablet      100 mcg daily.         . propranolol (INDERAL) 60 MG tablet   Oral   Take 60 mg by mouth 2 (two) times daily.         Marland Kitchen  rOPINIRole (REQUIP XL) 2 MG 24 hr tablet   Oral   Take 1 tablet (2 mg total) by mouth at bedtime.   30 tablet   6   . acetaminophen (TYLENOL) 650 MG CR tablet   Oral   Take 650 mg by mouth every 8 (eight) hours as needed for pain.         . cloNIDine (CATAPRES) 0.1 MG tablet   Oral   Take 0.1 mg by mouth at bedtime as needed (For anxiety.).          Marland Kitchen potassium chloride (K-DUR) 10 MEQ tablet   Oral   Take 1 tablet (10 mEq total) by mouth daily.   5 tablet   0    BP 146/63  Pulse 58  Temp(Src) 97.6 F (36.4 C) (Oral)  Resp 18  SpO2 97% Physical Exam  Nursing note and vitals reviewed. Constitutional: She is oriented to person, place, and time. She appears well-developed and well-nourished. No distress.  HENT:  Head: Normocephalic and atraumatic.  Eyes: EOM are normal.  Neck: Normal range of motion.  Cardiovascular: Normal rate, regular rhythm and normal heart sounds.   Pulmonary/Chest: Effort normal and breath sounds normal.  Abdominal: Soft. She exhibits no distension. There is no tenderness.  Musculoskeletal: Normal range of motion.  Full range of motion as the bilateral hips.  Mild tenderness of her right trochanter.  No lower extremity shortening or rotation.  Full range of motion bilateral hips  and ankles.  Full range of motion bilateral wrists elbows and shoulders.  Neurological: She is alert and oriented to person, place, and time.  Skin: Skin is warm and dry.  Psychiatric: She has a normal mood and affect. Judgment normal.    ED Course  Procedures (including critical care time) Labs Review Labs Reviewed  POCT I-STAT, CHEM 8 - Abnormal; Notable for the following:    Potassium 2.9 (*)    Chloride 95 (*)    Glucose, Bld 167 (*)    All other components within normal limits  URINALYSIS, ROUTINE W REFLEX MICROSCOPIC   Imaging Review Dg Hip Complete Right  03/27/2013   CLINICAL DATA:  Fall  EXAM: RIGHT HIP - COMPLETE 2+ VIEW  COMPARISON:  None.  FINDINGS: Osteopenia.  No acute fracture.  No dislocation.  IMPRESSION: No acute bony pathology.   Electronically Signed   By: Maryclare Bean M.D.   On: 03/27/2013 15:45   Ct Head Wo Contrast  03/27/2013   CLINICAL DATA:  80 year old who fell. Dizziness. Right frontal hematoma.  EXAM: CT HEAD WITHOUT CONTRAST  TECHNIQUE: Contiguous axial images were obtained from the base of the skull through the vertex without intravenous contrast.  COMPARISON:  03/26/2013, 12/19/2012, 01/07/2012, 09/30/2010.  FINDINGS: Severe cortical atrophy, moderate to severe deep atrophy, and moderate to severe cerebellar atrophy, slightly progressive since 2012. Moderate to severe changes of small vessel disease of the white matter diffusely, also progressive. No mass lesion. No midline shift. No acute hemorrhage or hematoma. No extra-axial fluid collections. No evidence of acute infarction.  Right frontal scalp hematoma without underlying skull fracture. Opacification of multiple ethmoid air cells bilaterally, mucosal thickening and air-fluid level in right maxillary sinus, mucosal thickening in the left maxillary sinus, and mucosal thickening in both frontal sinuses. Bilateral mastoid air cells and middle ear cavities well aerated. Extensive bilateral carotid siphon and  vertebral artery atherosclerosis.  IMPRESSION: 1. No acute intracranial abnormality. 2. Moderate to severe generalized atrophy and chronic microvascular ischemic changes of  the white matter. 3. Right frontal scalp hematoma without underlying skull fracture. 4. Pansinusitis, including acute right maxillary sinusitis.   Electronically Signed   By: Hulan Saas M.D.   On: 03/27/2013 15:24   Ct Head Wo Contrast  03/26/2013   CLINICAL DATA:  Fall.  EXAM: CT HEAD WITHOUT CONTRAST  TECHNIQUE: Contiguous axial images were obtained from the base of the skull through the vertex without intravenous contrast.  COMPARISON:  CT 12/19/2012.  FINDINGS: Prominent artifact noted about this exam due to inability to position patient appropriately. No mass or hydrocephalus. Diffuse atrophy. No definite hemorrhage. No acute bony abnormality. Frontal, bilateral ethmoidal and maxillary sinus mucosal thickening. Findings are consistent with sinusitis.  IMPRESSION: Findings consistent with sinusitis. Otherwise no acute intracranial abnormality identified. Exam is limited due to patient positioning.   Electronically Signed   By: Maisie Fus  Register   On: 03/26/2013 13:33   Ct Cervical Spine Wo Contrast  03/26/2013   CLINICAL DATA:  Fall.  EXAM: CT CERVICAL SPINE WITHOUT CONTRAST  TECHNIQUE: Multidetector CT imaging of the cervical spine was performed without intravenous contrast. Multiplanar CT image reconstructions were also generated.  COMPARISON:  CT 12/19/2012 .  FINDINGS: Diffuse degenerative changes of the cervical spine noted with mild subluxation C4 on C5. No evidence of fracture or dislocation, limited exam due to difficulty in positioning patient.  IMPRESSION: Severe degenerative changes cervical spine with stable mild degenerative subluxation C4 on C5. No acute abnormality. Limited exam due to patient position.   Electronically Signed   By: Maisie Fus  Register   On: 03/26/2013 13:39  I personally reviewed the imaging tests  through PACS system I reviewed available ER/hospitalization records through the EMR   EKG Interpretation   None       MDM   1. Scalp hematoma, initial encounter   2. Hypokalemia   3. Right hip pain    Suspect mechanical fall.  Mild hypo-K. limen noted on laboratory studies.  This will be replaced orally in the emergency department as well as with prescription of next several days.  Urinalysis pending.  CT head normal.  C-spine is nontender.  Right hip film without acute pathology    Lyanne Co, MD 03/27/13 [phone]

## 2013-03-30 ENCOUNTER — Telehealth (HOSPITAL_COMMUNITY): Payer: Self-pay | Admitting: Emergency Medicine

## 2013-03-30 LAB — URINE CULTURE: Colony Count: >=100000

## 2013-03-30 NOTE — ED Notes (Signed)
Post ED Visit - Positive Culture Follow-up  Culture report reviewed by antimicrobial stewardship pharmacist: [] Wes Dulaney, Pharm.D., BCPS [] Jeremy Frens, Pharm.D., BCPS [] Elizabeth Martin, Pharm.D., BCPS [] Minh Pham, Pharm.D., BCPS, AAHIVP [] Michelle Turner, Pharm.D., BCPS, AAHIVP [x] Andrew Meyer, Pharm.D., BCPS  Positive urine culture Treated with Keflex, organism sensitive to the same and no further patient follow-up is required at this time.  Erhard Senske 03/30/2013, 11:46 AM   

## 2013-04-06 ENCOUNTER — Telehealth: Payer: Self-pay

## 2013-04-06 DIAGNOSIS — G20 Parkinson's disease: Secondary | ICD-10-CM

## 2013-04-06 DIAGNOSIS — R42 Dizziness and giddiness: Secondary | ICD-10-CM

## 2013-04-06 DIAGNOSIS — Z9181 History of falling: Secondary | ICD-10-CM

## 2013-04-06 NOTE — Telephone Encounter (Signed)
This patient has had a number of recent falls that included an ER visit on Mar 27 2013. Patient was sent home from ER due to high risk of influenza exposure, per Darl PikesSusan Overturf/Guardian/Daughter-In-Law.  Patient is in [facility] in [place].   Miss Fulton ReekSusan Crass is requesting that Dr. Terrace ArabiaYan make a referral for Physical Therapy to [facility] on [address] due to insurance changes and the fact that this would place Ms. Marylene LandValeria Laprade closer to family.  February 15, 2013 OV note refers to Physical Therapy as part of her plan but, it does not seem that a referral was made. Please make referral.  Fulton ReekSusan Dau [phone].

## 2013-08-16 ENCOUNTER — Ambulatory Visit: Payer: Medicare HMO | Admitting: Nurse Practitioner

## 2013-08-16 ENCOUNTER — Telehealth: Payer: Self-pay | Admitting: Nurse Practitioner

## 2013-08-16 NOTE — Telephone Encounter (Signed)
No show for scheduled appt 

## 2013-10-30 DEATH — deceased

## 2015-03-02 IMAGING — CT CT HEAD W/O CM
2 series · 16 of 30 positions shown, 19 images · non-contrast
Comparison: 03/26/2013, 12/19/2012, 01/07/2012, 09/30/2010.

CLINICAL DATA: 85-year-old who fell. Dizziness. Right frontal
hematoma.

EXAM:
CT HEAD WITHOUT CONTRAST
TECHNIQUE: Contiguous axial images were obtained from the base of the skull
through the vertex without intravenous contrast.

[Series 2: head w/o · axial · non-contrast · 0.43mm/px · z∈[+1341,+1461]mm · 9 of 30 slices shown, 12 images]
[im 3/30  brain]
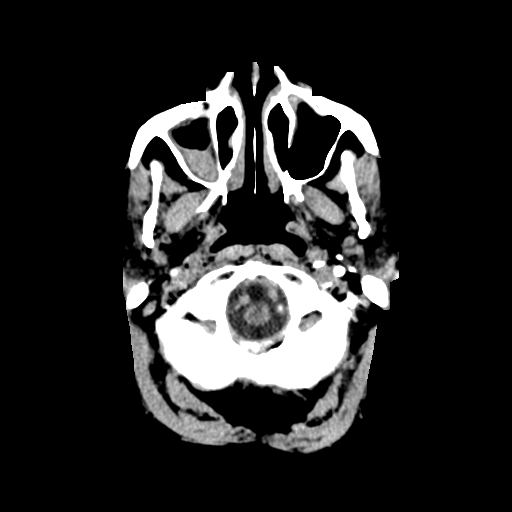
[im 3/30  bone]
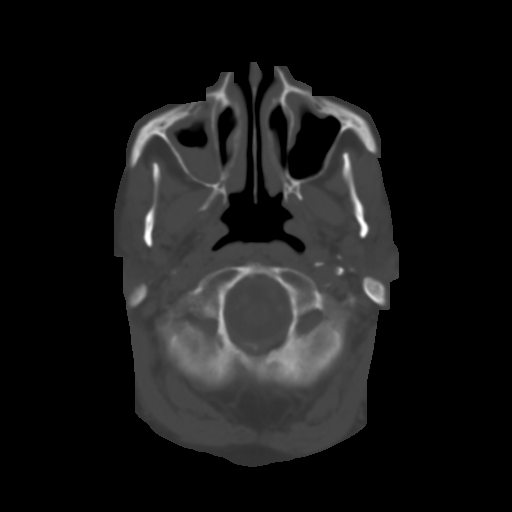
[im 6/30  brain]
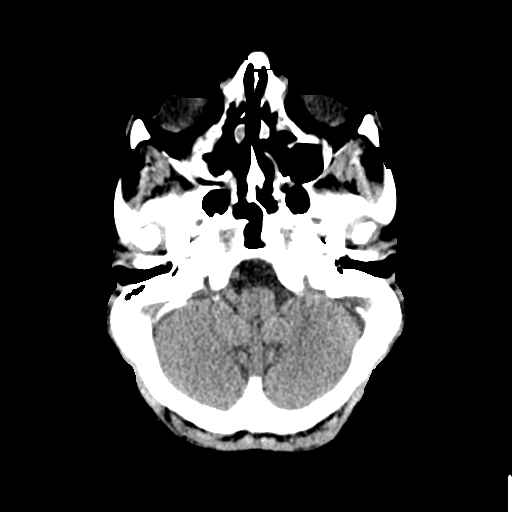
[im 9/30  brain]
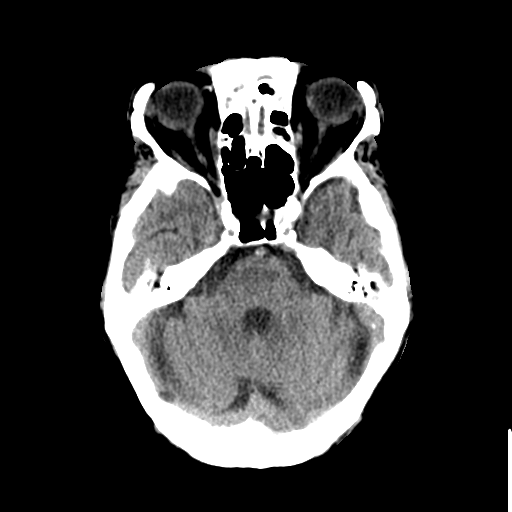
[im 12/30  brain]
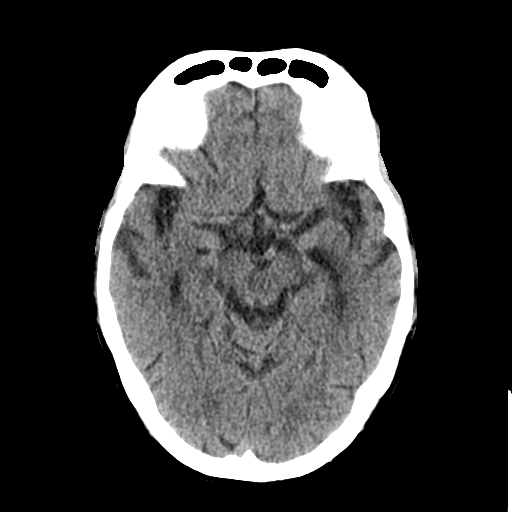
[im 15/30  brain]
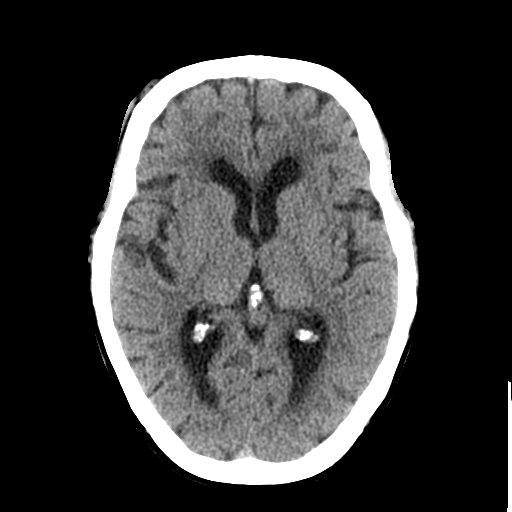
[im 15/30  bone]
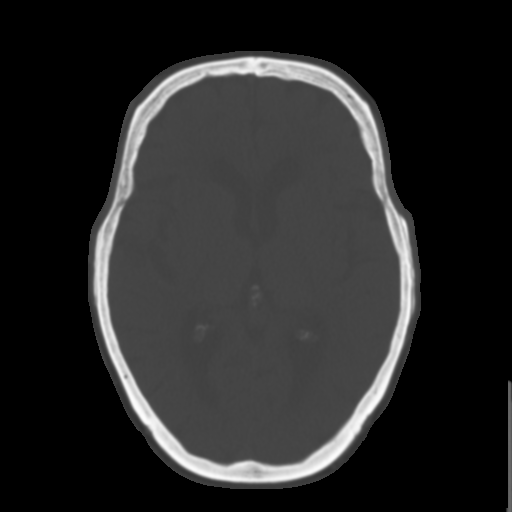
[im 18/30  brain]
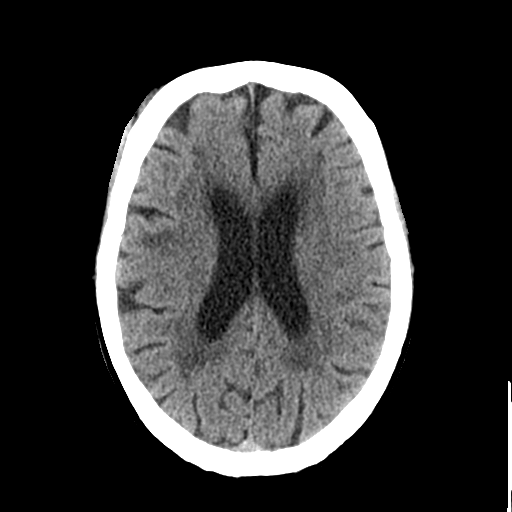
[im 21/30  brain]
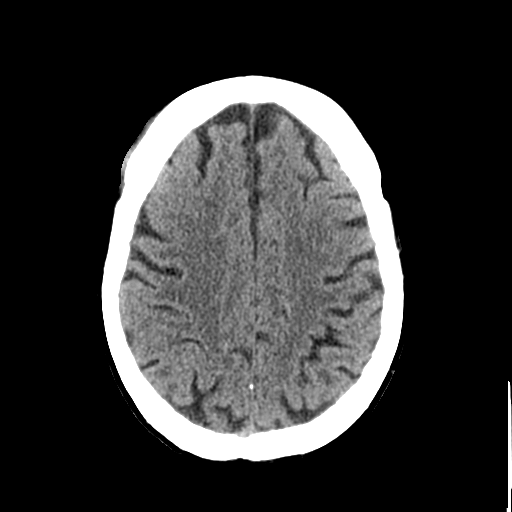
[im 24/30  brain]
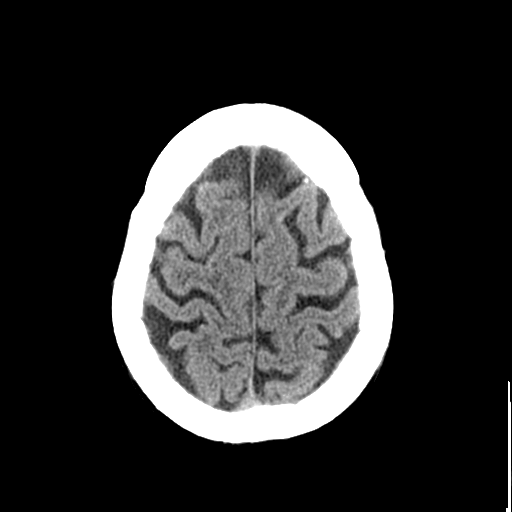
[im 27/30  brain]
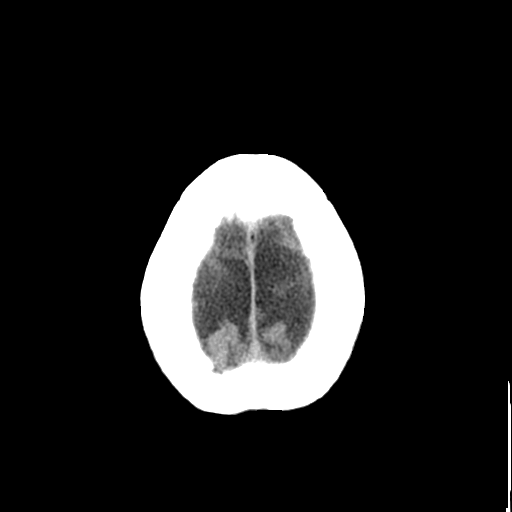
[im 27/30  bone]
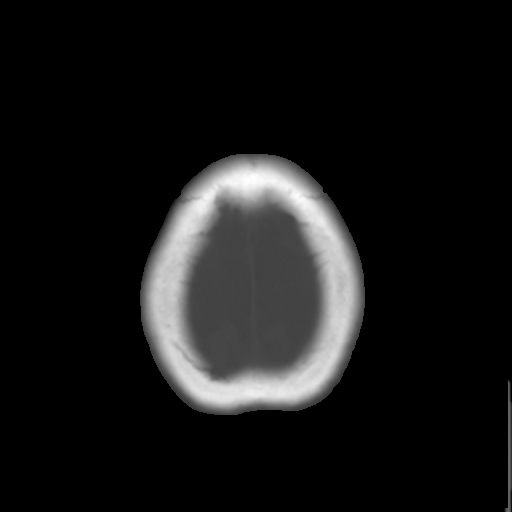

[Series 3: bone windows · axial · 0.43mm/px · z∈[+1346,+1445]mm · 7 of 50 slices shown]
[im 6/50  bone]
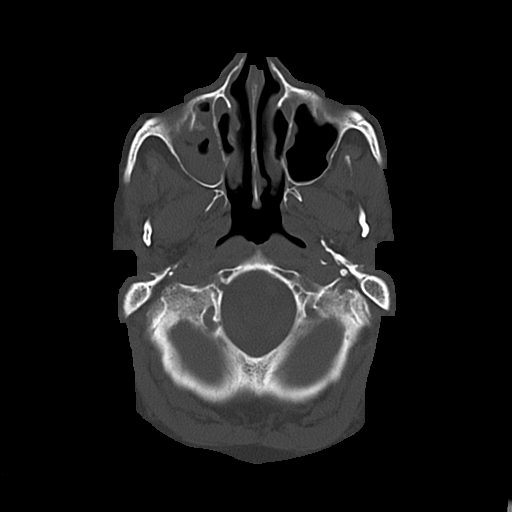
[im 11/50  bone]
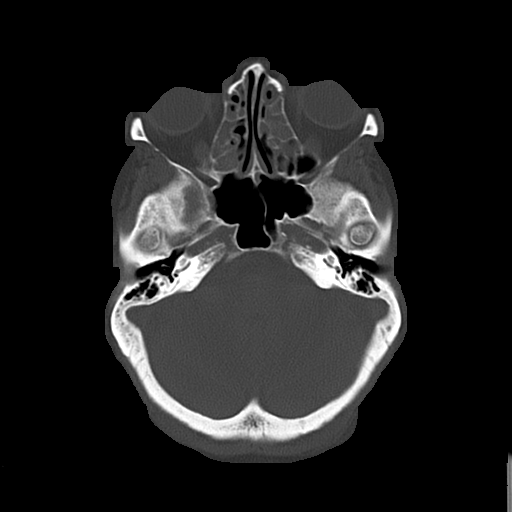
[im 17/50  bone]
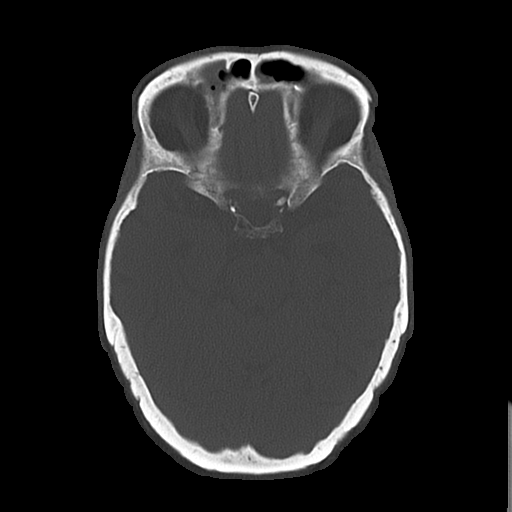
[im 22/50  bone]
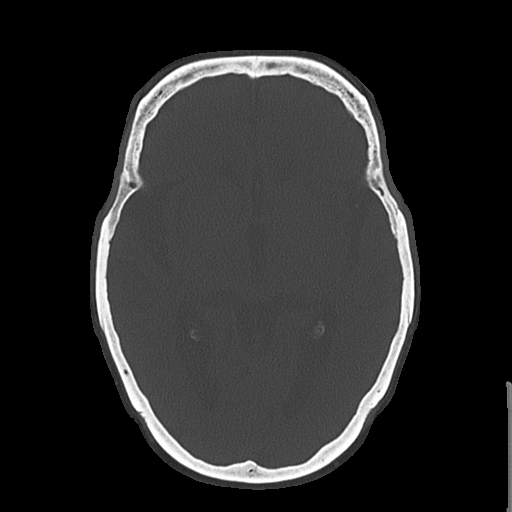
[im 28/50  bone]
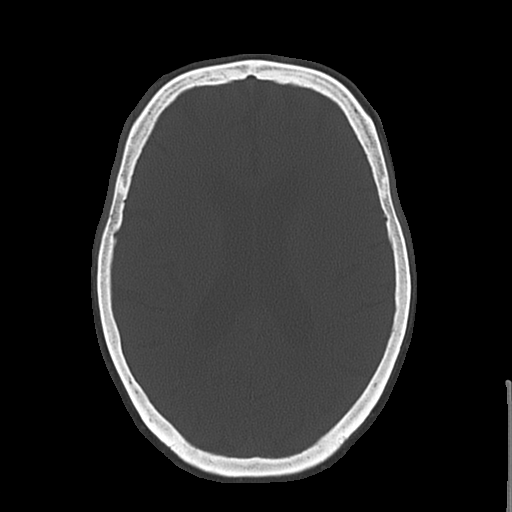
[im 33/50  bone]
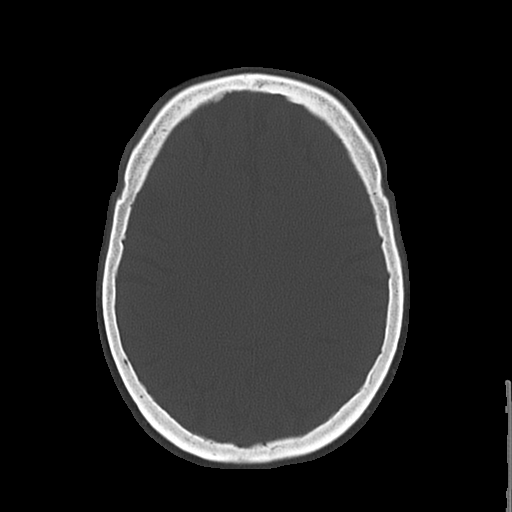
[im 39/50  bone]
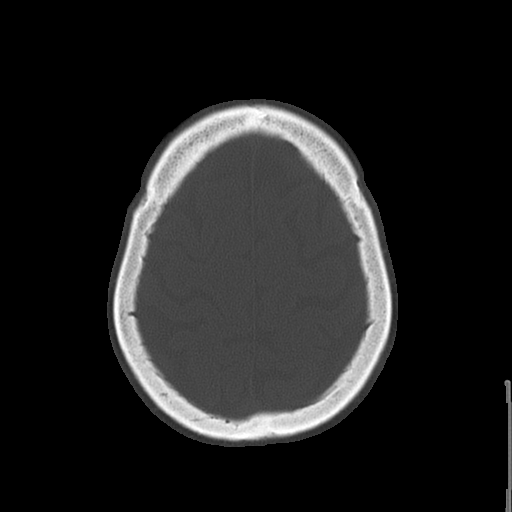

[16 of 30 positions shown; findings below may reference images not displayed]

FINDINGS: Severe cortical atrophy, moderate to severe deep atrophy, and
moderate to severe cerebellar atrophy, slightly progressive since
0200. Moderate to severe changes of small vessel disease of the
white matter diffusely, also progressive. No mass lesion. No midline
shift. No acute hemorrhage or hematoma. No extra-axial fluid
collections. No evidence of acute infarction.

Right frontal scalp hematoma without underlying skull fracture.
Opacification of multiple ethmoid air cells bilaterally, mucosal
thickening and air-fluid level in right maxillary sinus, mucosal
thickening in the left maxillary sinus, and mucosal thickening in
both frontal sinuses. Bilateral mastoid air cells and middle ear
cavities well aerated. Extensive bilateral carotid siphon and
vertebral artery atherosclerosis.
IMPRESSION: 1. No acute intracranial abnormality.
2. Moderate to severe generalized atrophy and chronic microvascular
ischemic changes of the white matter.
3. Right frontal scalp hematoma without underlying skull fracture.
4. Pansinusitis, including acute right maxillary sinusitis.
